# Patient Record
Sex: Female | Born: 1950
Health system: Southern US, Community
[De-identification: ages and names within clinical notes are randomized; demographics above are authoritative.]

## PROBLEM LIST (undated history)

## (undated) DIAGNOSIS — I252 Old myocardial infarction: Secondary | ICD-10-CM

## (undated) DIAGNOSIS — I10 Essential (primary) hypertension: Secondary | ICD-10-CM

## (undated) DIAGNOSIS — I214 Non-ST elevation (NSTEMI) myocardial infarction: Secondary | ICD-10-CM

## (undated) DIAGNOSIS — E785 Hyperlipidemia, unspecified: Secondary | ICD-10-CM

## (undated) DIAGNOSIS — B009 Herpesviral infection, unspecified: Secondary | ICD-10-CM

## (undated) DIAGNOSIS — I472 Ventricular tachycardia: Secondary | ICD-10-CM

## (undated) DIAGNOSIS — L409 Psoriasis, unspecified: Secondary | ICD-10-CM

## (undated) DIAGNOSIS — F419 Anxiety disorder, unspecified: Secondary | ICD-10-CM

## (undated) DIAGNOSIS — Z95820 Peripheral vascular angioplasty status with implants and grafts: Secondary | ICD-10-CM

## (undated) HISTORY — DX: Psoriasis, unspecified: L40.9

## (undated) HISTORY — DX: Old myocardial infarction: I25.2

## (undated) HISTORY — PX: TONSILLECTOMY AND ADENOIDECTOMY: SHX28

## (undated) HISTORY — DX: Herpesviral infection, unspecified: B00.9

## (undated) HISTORY — DX: Anxiety disorder, unspecified: F41.9

---

## 2003-11-09 ENCOUNTER — Ambulatory Visit (HOSPITAL_COMMUNITY): Admission: RE | Admit: 2003-11-09 | Discharge: 2003-11-09 | Payer: Self-pay | Admitting: Obstetrics and Gynecology

## 2004-12-28 IMAGING — US US PELVIS COMPLETE MODIFY
1 series · 14 of 25 positions shown · non-contrast
Comparison: none

CLINICAL DATA: Pelvic pain.  

 PELVIC ULTRASOUND ? TRANSABDOMINAL AND ENDOVAGINAL ? 11/09/03 AT 4433 HOURS

[Series 1: unknown · 0.38mm/px · 14 of 52 slices shown]
[im 1/52]
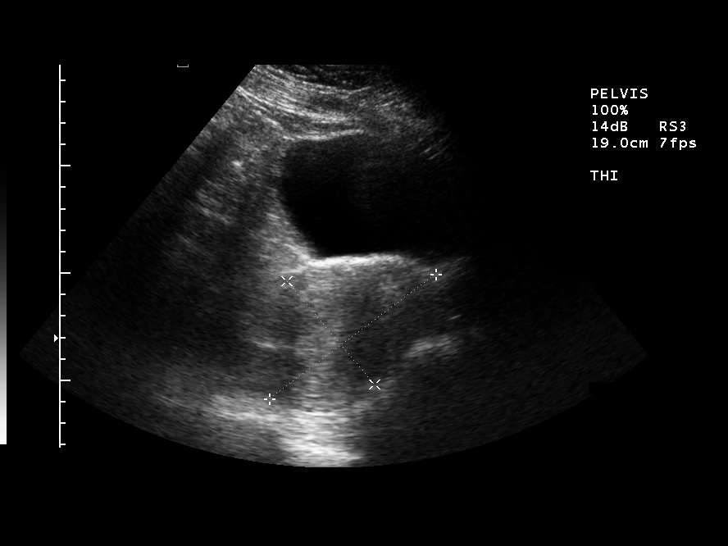
[im 5/52]
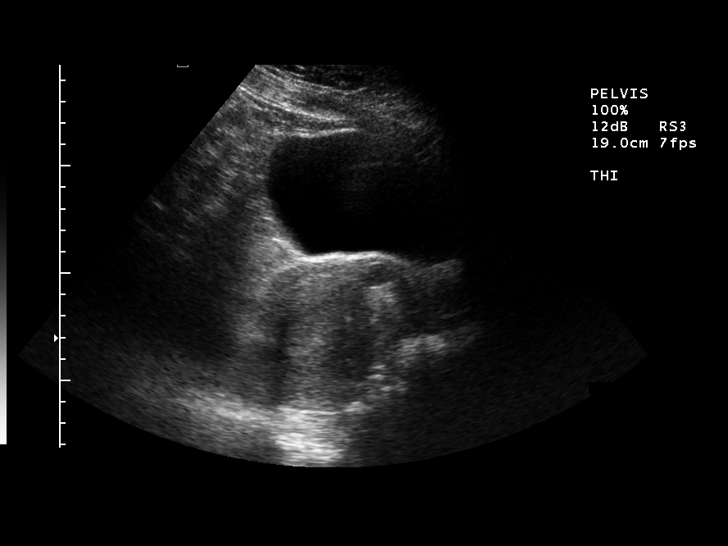
[im 9/52]
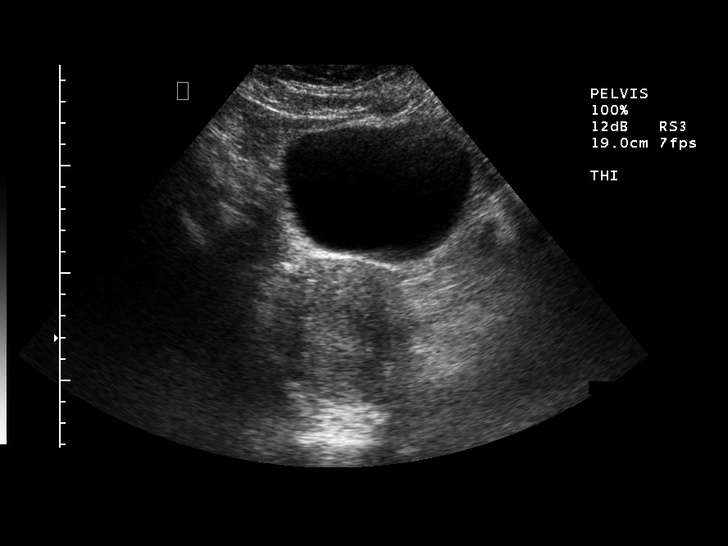
[im 13/52]
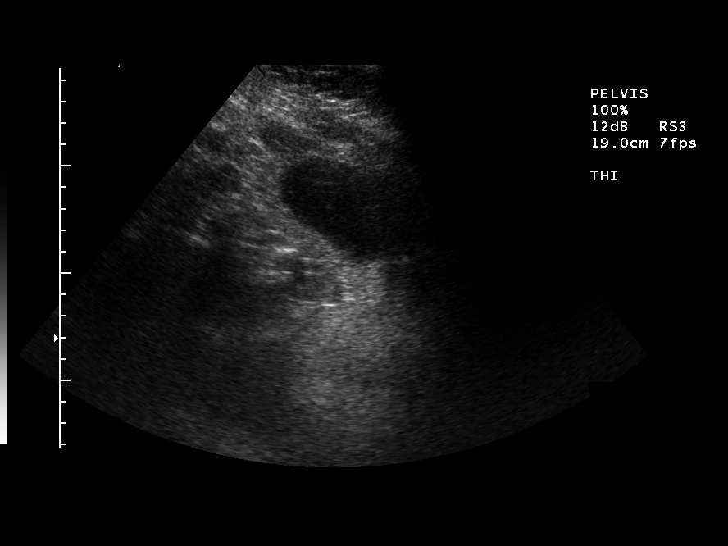
[im 18/52]
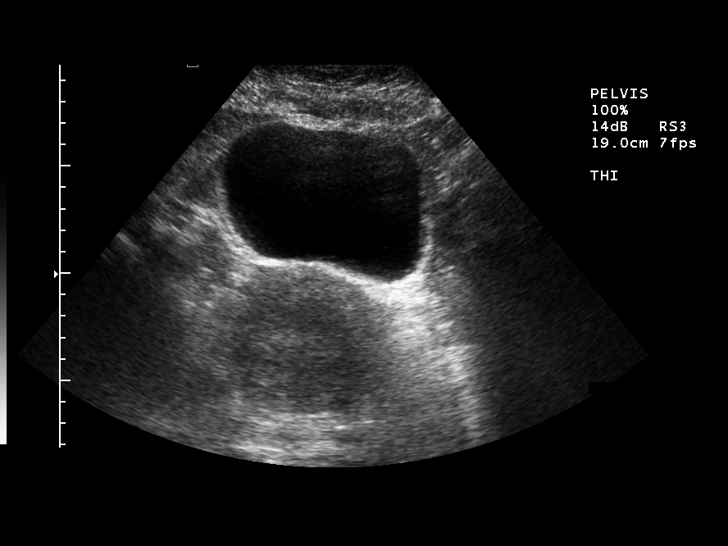
[im 20/52]
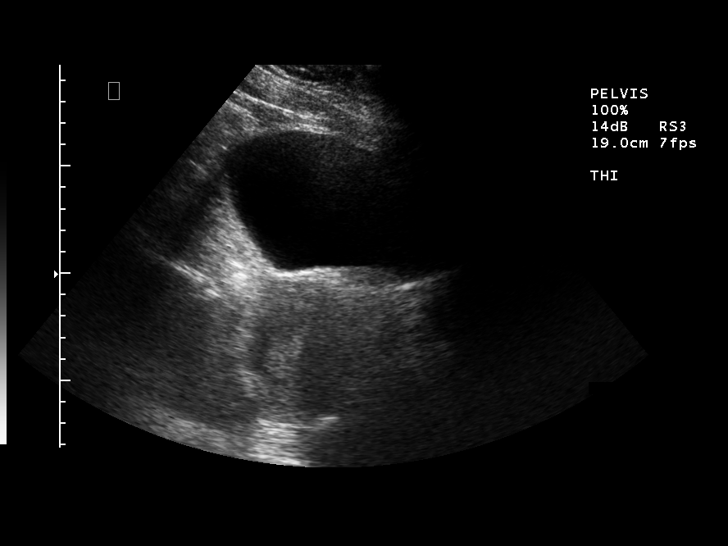
[im 24/52]
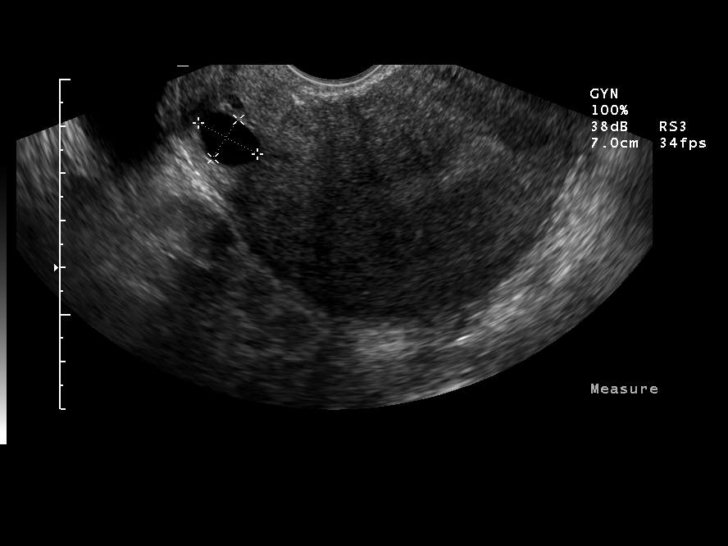
[im 28/52]
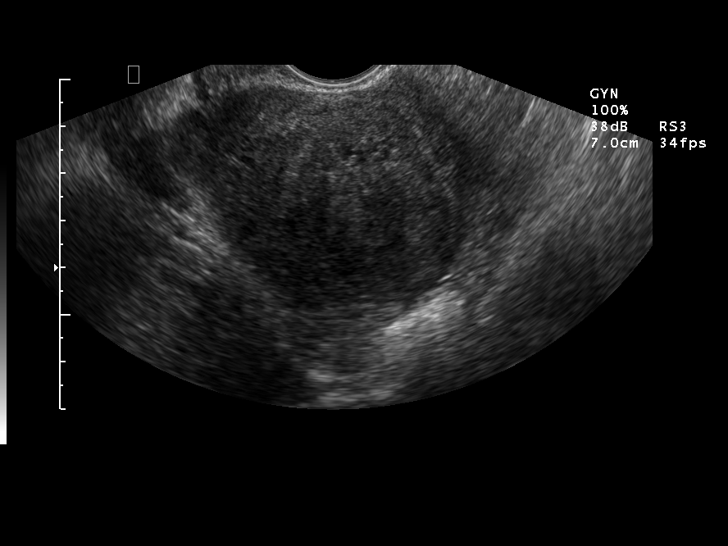
[im 32/52]
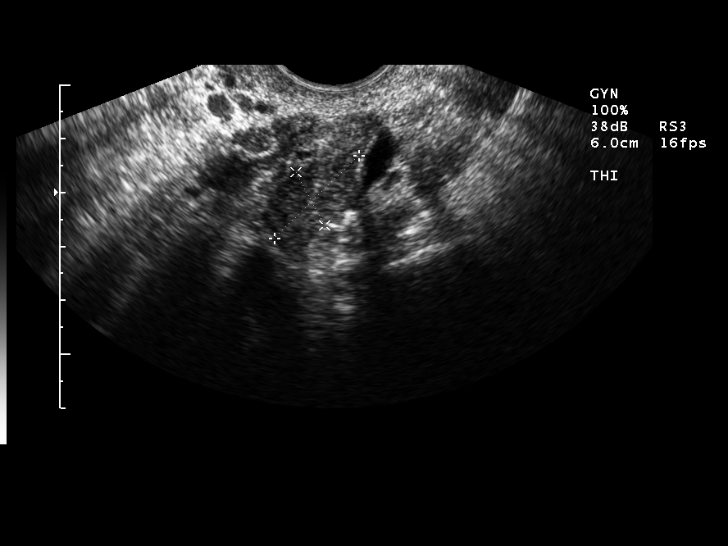
[im 35/52]
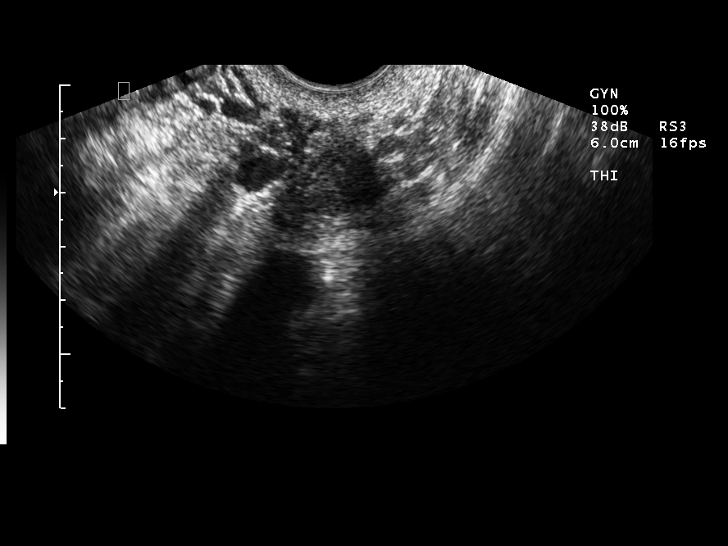
[im 39/52]
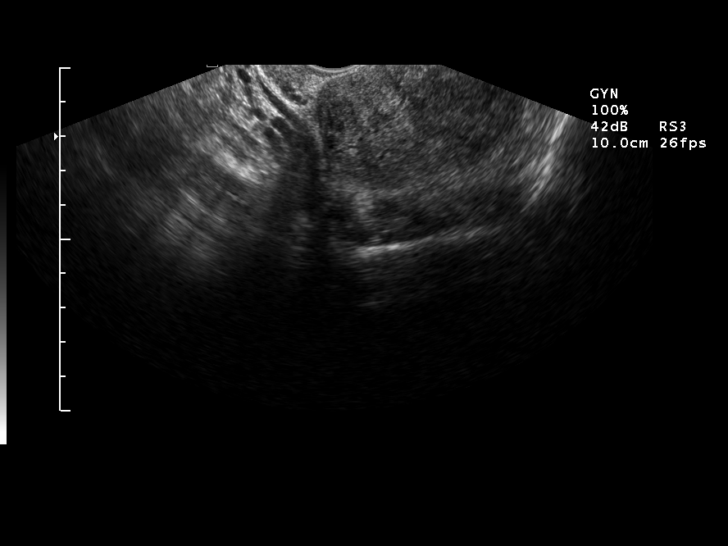
[im 43/52]
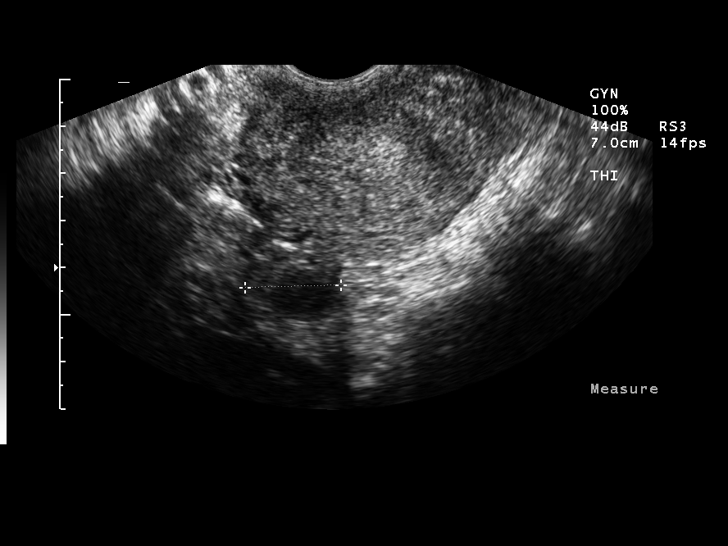
[im 47/52]
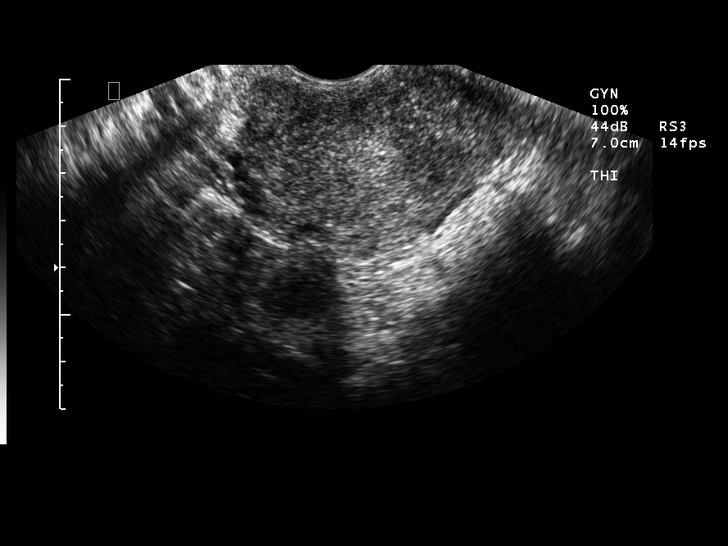
[im 52/52]
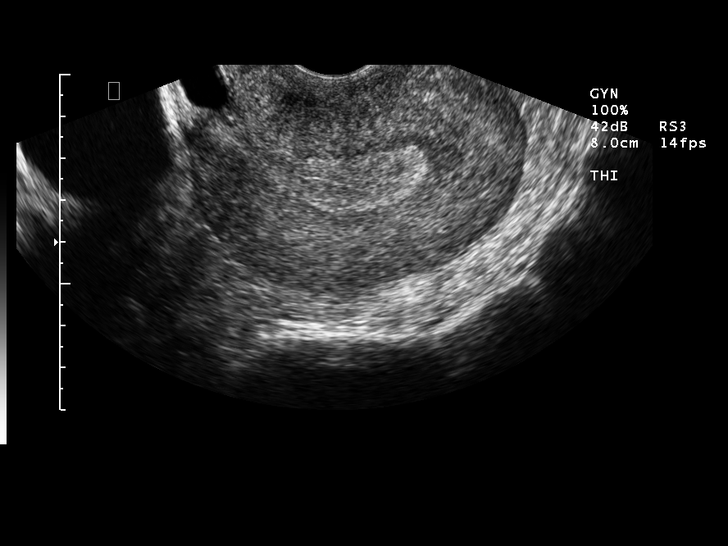

[14 of 25 positions shown; findings below may reference images not displayed]

FINDINGS: The uterus is 8.9 x 5.0 x 7.3 cm.  No masses are seen.  Echotexture of the myometrium is somewhat inhomogeneous of unknown significance.  A small nabothian cyst is noted.  The endometrial stripe is 13 mm in thickness.  Apparently the patient is still menstruating.

 The right ovary is 2.6 x 1.4 x 2.0 cm.  There is a simple 1.3 cm right ovarian cyst.  The left ovary is 2.2 x 1.1 x 1.5 cm.  There is no evidence of free fluid.

 IMPRESSION
 Echotexture of the myometrium is somewhat homogeneous of unknown significance.  The examination is otherwise within normal limits.

## 2008-10-25 ENCOUNTER — Encounter: Admission: RE | Admit: 2008-10-25 | Discharge: 2008-10-25 | Payer: Self-pay | Admitting: Family Medicine

## 2009-12-14 IMAGING — CR DG CHEST 2V
2 series · 2 of 2 positions shown · non-contrast
Comparison: No prior studies.

CLINICAL DATA: 2 months of dry cough.  Worse at night.

CHEST - 2 VIEW

[w chest pa]
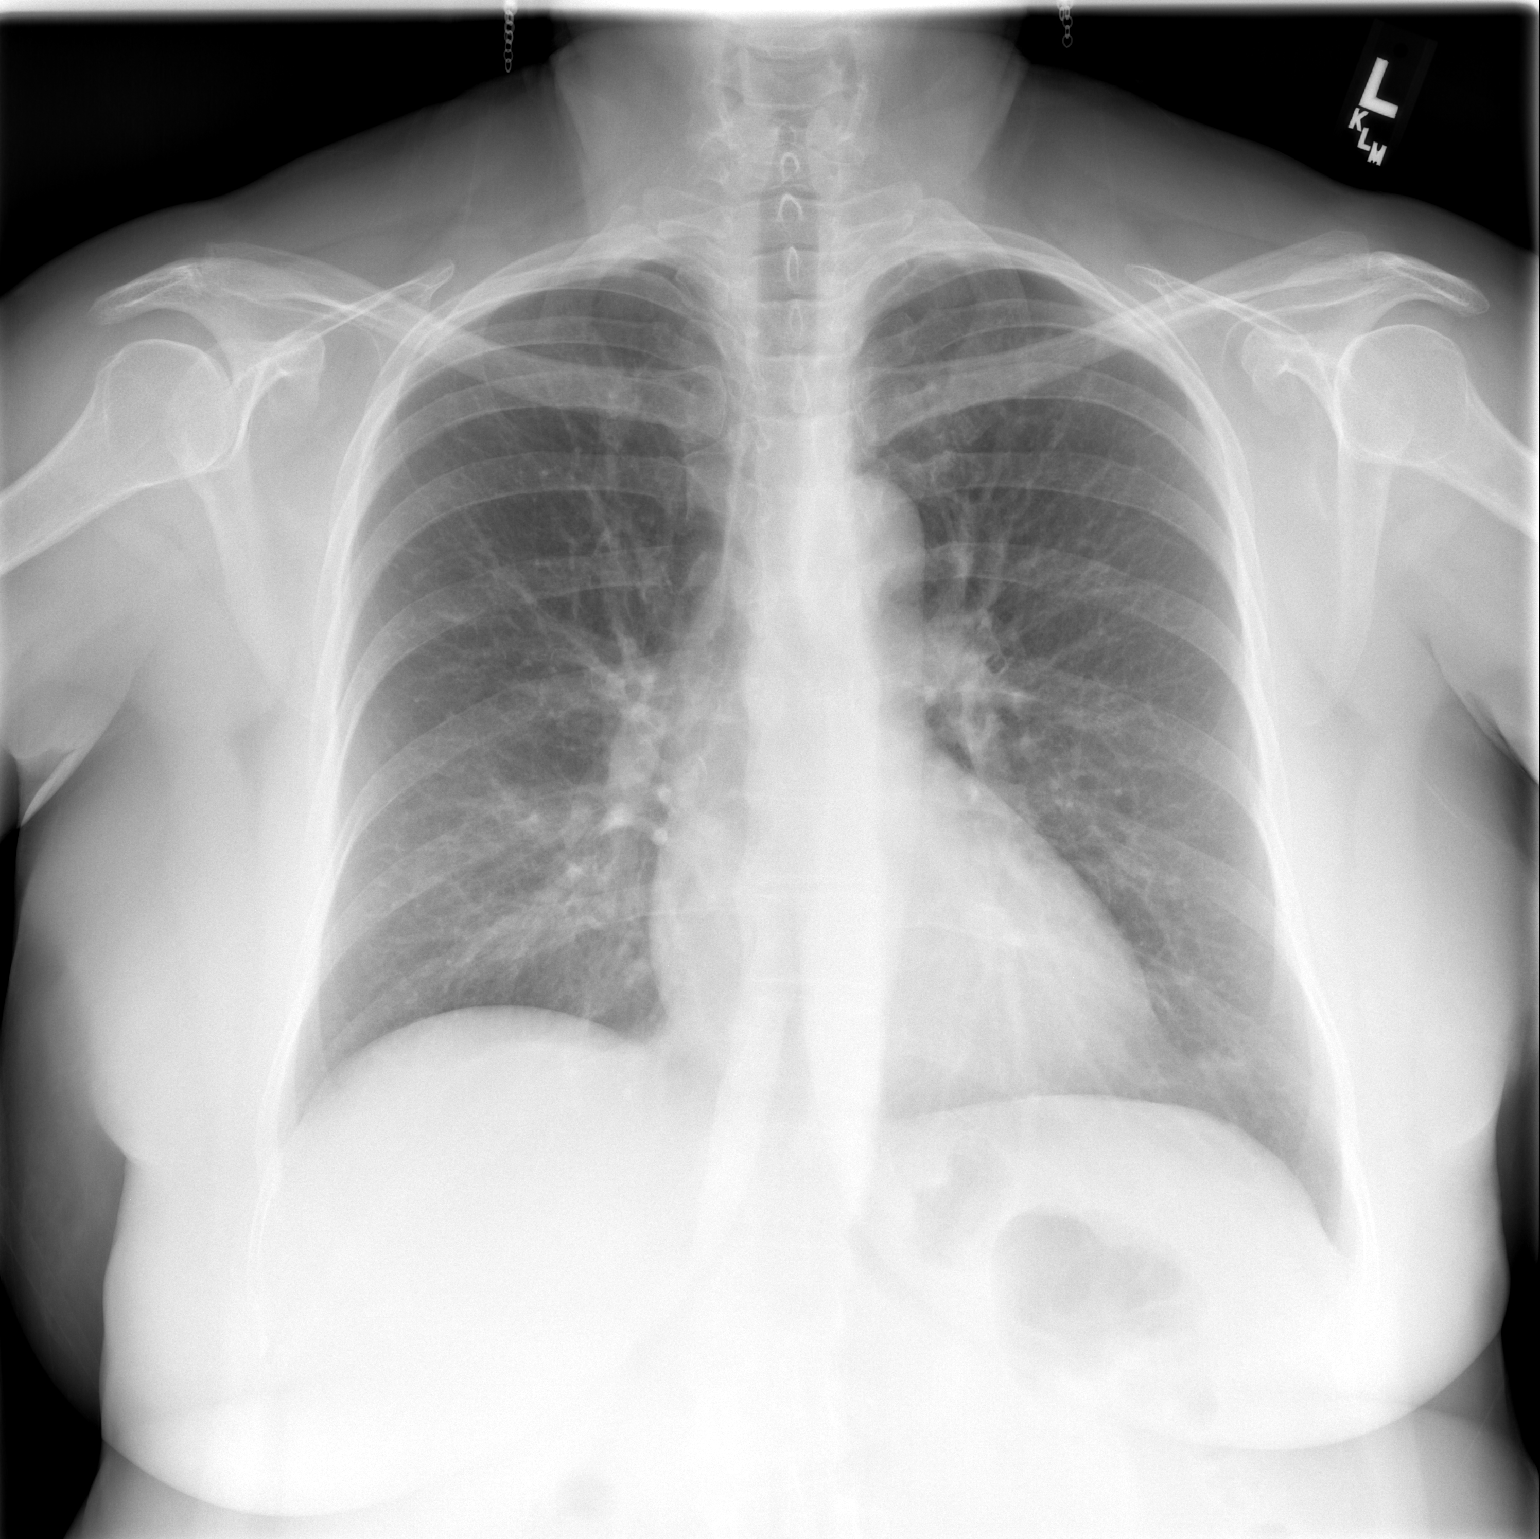

[w chest lat]
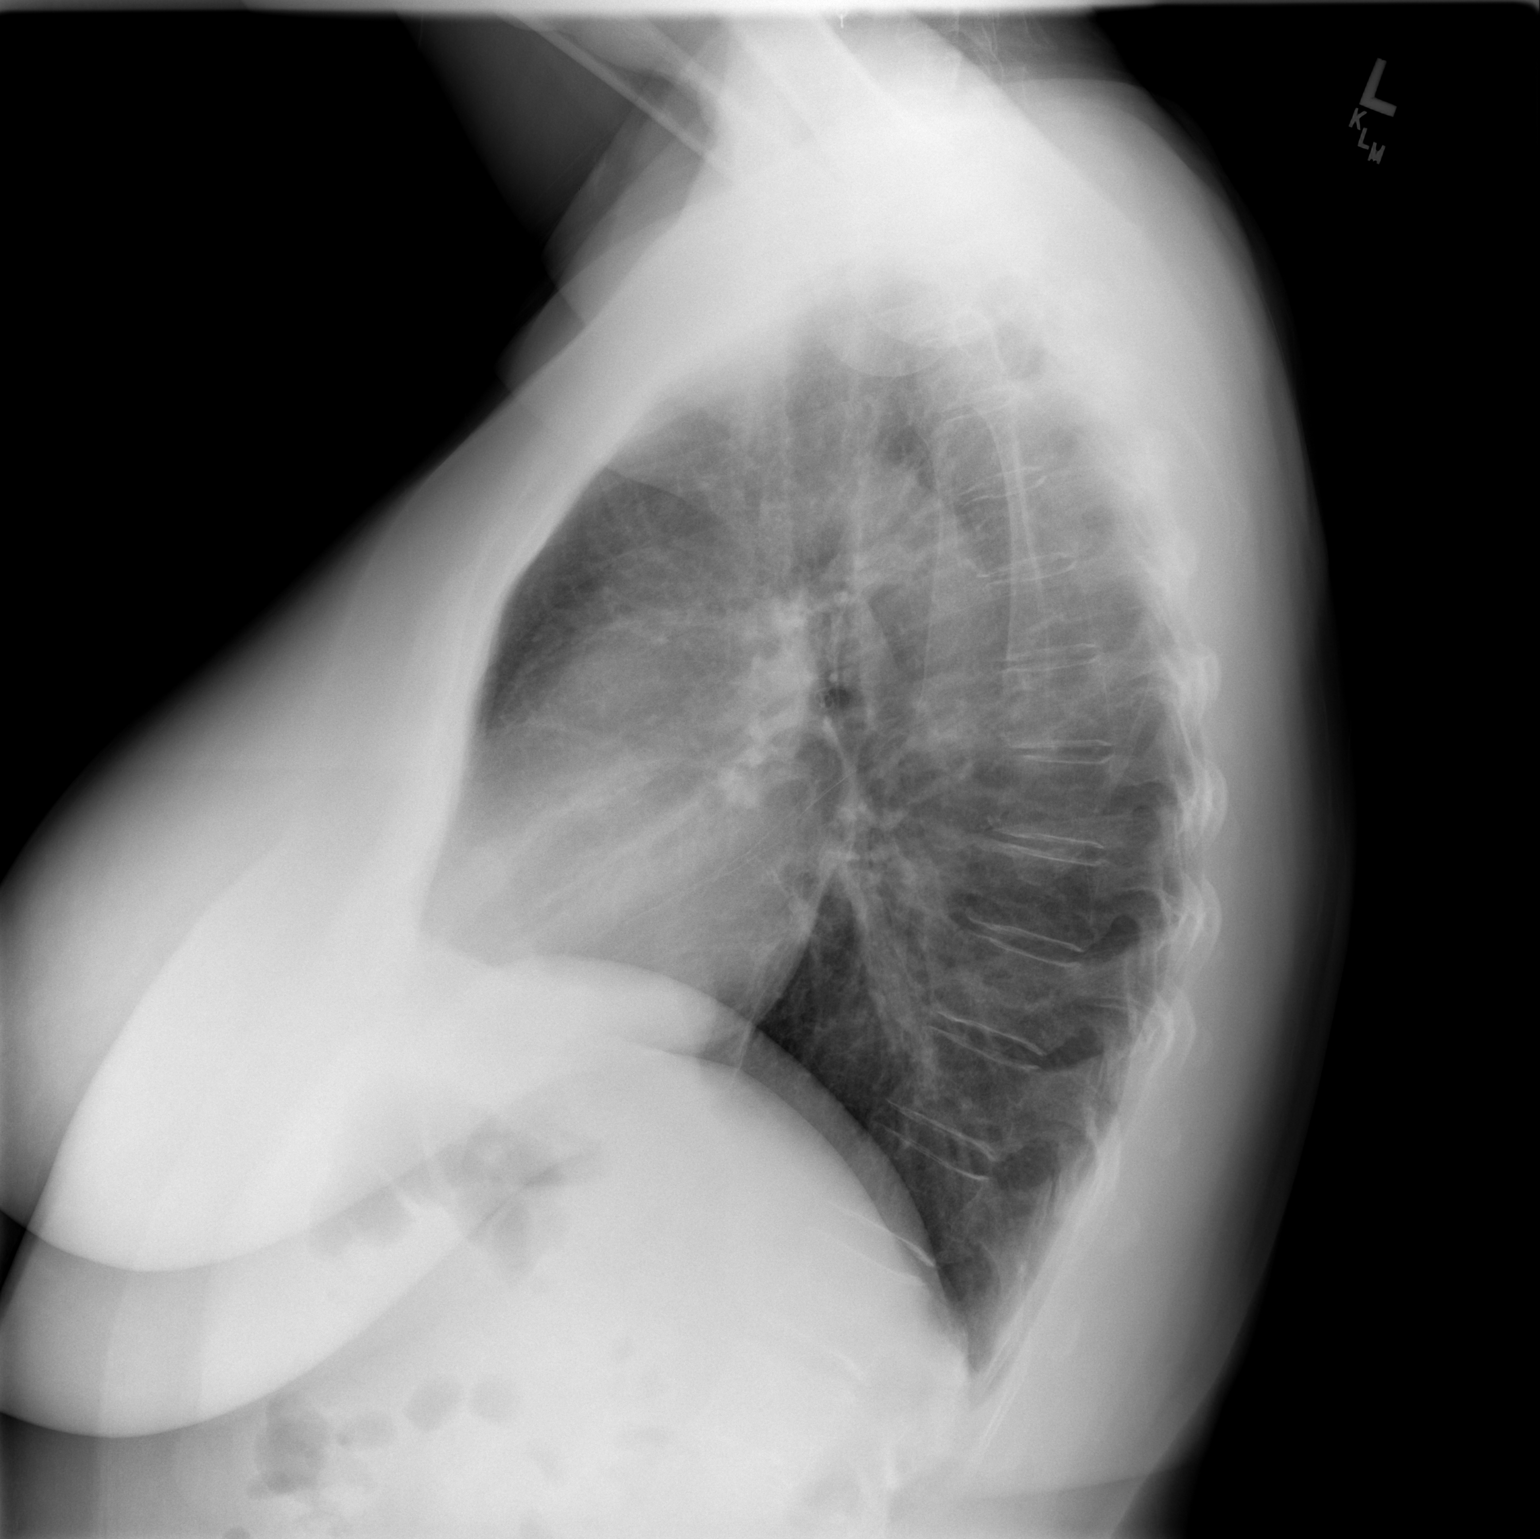

[2 of 2 positions shown; findings below may reference images not displayed]

FINDINGS: There are mildly accentuated bronchial markings present
consistent with mild changes of bronchitis.  There are no acute
infiltrates.  The heart and mediastinal structures are normal.
IMPRESSION: Mild bronchitic changes.  No acute infiltrate.

## 2010-07-31 ENCOUNTER — Emergency Department (HOSPITAL_COMMUNITY): Admission: EM | Admit: 2010-07-31 | Discharge: 2010-07-31 | Payer: Self-pay | Admitting: Emergency Medicine

## 2011-01-25 LAB — POCT I-STAT, CHEM 8
BUN: 9 mg/dL (ref 6–23)
Calcium, Ion: 1.11 mmol/L — ABNORMAL LOW (ref 1.12–1.32)
Chloride: 103 mEq/L (ref 96–112)
Creatinine, Ser: 0.8 mg/dL (ref 0.4–1.2)
Glucose, Bld: 121 mg/dL — ABNORMAL HIGH (ref 70–99)
HCT: 45 % (ref 36.0–46.0)
Hemoglobin: 15.3 g/dL — ABNORMAL HIGH (ref 12.0–15.0)
Potassium: 3.9 mEq/L (ref 3.5–5.1)
Sodium: 140 mEq/L (ref 135–145)
TCO2: 27 mmol/L (ref 0–100)

## 2011-01-25 LAB — URINE MICROSCOPIC-ADD ON

## 2011-01-25 LAB — URINALYSIS, ROUTINE W REFLEX MICROSCOPIC
Bilirubin Urine: NEGATIVE
Glucose, UA: NEGATIVE mg/dL
Ketones, ur: NEGATIVE mg/dL
Leukocytes, UA: NEGATIVE
Nitrite: NEGATIVE
Protein, ur: NEGATIVE mg/dL
Specific Gravity, Urine: 1.008 (ref 1.005–1.030)
Urobilinogen, UA: 0.2 mg/dL (ref 0.0–1.0)
pH: 6 (ref 5.0–8.0)

## 2011-05-29 ENCOUNTER — Other Ambulatory Visit (HOSPITAL_COMMUNITY)
Admission: RE | Admit: 2011-05-29 | Discharge: 2011-05-29 | Disposition: A | Discharge status: Home / Self Care | Payer: Managed Care, Other (non HMO) | Source: Ambulatory Visit | Attending: Family Medicine | Admitting: Family Medicine

## 2011-05-29 DIAGNOSIS — Z124 Encounter for screening for malignant neoplasm of cervix: Secondary | ICD-10-CM | POA: Insufficient documentation

## 2012-06-22 ENCOUNTER — Emergency Department (HOSPITAL_COMMUNITY): Payer: Managed Care, Other (non HMO)

## 2012-06-22 ENCOUNTER — Encounter (HOSPITAL_COMMUNITY): Payer: Self-pay | Admitting: Emergency Medicine

## 2012-06-22 ENCOUNTER — Emergency Department (HOSPITAL_COMMUNITY)
Admission: EM | Admit: 2012-06-22 | Discharge: 2012-06-22 | Disposition: A | Discharge status: Home / Self Care | Payer: Managed Care, Other (non HMO) | Attending: Emergency Medicine | Admitting: Emergency Medicine

## 2012-06-22 DIAGNOSIS — I10 Essential (primary) hypertension: Secondary | ICD-10-CM | POA: Insufficient documentation

## 2012-06-22 DIAGNOSIS — Z79899 Other long term (current) drug therapy: Secondary | ICD-10-CM | POA: Insufficient documentation

## 2012-06-22 DIAGNOSIS — R002 Palpitations: Secondary | ICD-10-CM

## 2012-06-22 DIAGNOSIS — R51 Headache: Secondary | ICD-10-CM | POA: Insufficient documentation

## 2012-06-22 DIAGNOSIS — R079 Chest pain, unspecified: Secondary | ICD-10-CM

## 2012-06-22 DIAGNOSIS — R0602 Shortness of breath: Secondary | ICD-10-CM | POA: Insufficient documentation

## 2012-06-22 HISTORY — DX: Essential (primary) hypertension: I10

## 2012-06-22 LAB — CBC WITH DIFFERENTIAL/PLATELET
Basophils Absolute: 0.1 10*3/uL (ref 0.0–0.1)
Basophils Relative: 1 % (ref 0–1)
Eosinophils Absolute: 0.2 10*3/uL (ref 0.0–0.7)
Eosinophils Relative: 2 % (ref 0–5)
HCT: 41.3 % (ref 36.0–46.0)
Hemoglobin: 14.3 g/dL (ref 12.0–15.0)
Lymphocytes Relative: 24 % (ref 12–46)
Lymphs Abs: 2.4 10*3/uL (ref 0.7–4.0)
MCH: 32 pg (ref 26.0–34.0)
MCHC: 34.6 g/dL (ref 30.0–36.0)
MCV: 92.4 fL (ref 78.0–100.0)
Monocytes Absolute: 0.7 10*3/uL (ref 0.1–1.0)
Monocytes Relative: 7 % (ref 3–12)
Neutro Abs: 6.6 10*3/uL (ref 1.7–7.7)
Neutrophils Relative %: 67 % (ref 43–77)
Platelets: 275 10*3/uL (ref 150–400)
RBC: 4.47 MIL/uL (ref 3.87–5.11)
RDW: 13 % (ref 11.5–15.5)
WBC: 9.9 10*3/uL (ref 4.0–10.5)

## 2012-06-22 LAB — BASIC METABOLIC PANEL
BUN: 11 mg/dL (ref 6–23)
CO2: 29 mEq/L (ref 19–32)
Calcium: 10.4 mg/dL (ref 8.4–10.5)
Chloride: 101 mEq/L (ref 96–112)
Creatinine, Ser: 0.8 mg/dL (ref 0.50–1.10)
GFR calc Af Amer: >90 mL/min (ref >90)
GFR calc non Af Amer: 79 mL/min — ABNORMAL LOW (ref >90)
Glucose, Bld: 120 mg/dL — ABNORMAL HIGH (ref 70–99)
Potassium: 4.3 mEq/L (ref 3.5–5.1)
Sodium: 139 mEq/L (ref 135–145)

## 2012-06-22 LAB — TROPONIN I: Troponin I: <0.3 ng/mL (ref <0.30)

## 2012-06-22 NOTE — ED Notes (Signed)
Ambulated patient to bathroom. S?O at the bedside

## 2012-06-22 NOTE — ED Provider Notes (Signed)
History     CSN: [id number]  Arrival date & time 06/22/12  5621   First MD Initiated Contact with Patient 06/22/12 0314      Chief Complaint  Patient presents with  . Headache    (Consider location/radiation/quality/duration/timing/severity/associated sxs/prior treatment) Patient is a 61 y.o. female presenting with headaches. The history is provided by the patient.  Headache  This is a new problem. Associated symptoms include palpitations. Pertinent negatives include no shortness of breath, no nausea and no vomiting.  patient had a sharp right-sided headache. It lasts around 2 hours. She later had some heaviness in her right arm. It was not necessarily associated with the headache. She later developed the feeling that her heart was going faster and some chest pressure. No trouble breathing. The symptoms have all pretty much resolved. Her blood pressure was found to be high at home 190/100. She states she's been running high recently. She is on HCTZ but only takes it occasionally. No fevers. No cough. She has family members that have died of heart attacks near her age.  Past Medical History  Diagnosis Date  . Hypertension     History reviewed. No pertinent past surgical history.  No family history on file.  History  Substance Use Topics  . Smoking status: Never Smoker   . Smokeless tobacco: Not on file  . Alcohol Use: No    OB History    Grav Para Term Preterm Abortions TAB SAB Ect Mult Living                  Review of Systems  Constitutional: Negative for activity change and appetite change.  HENT: Negative for neck stiffness.   Eyes: Negative for pain.  Respiratory: Negative for chest tightness and shortness of breath.   Cardiovascular: Positive for chest pain and palpitations. Negative for leg swelling.  Gastrointestinal: Negative for nausea, vomiting, abdominal pain and diarrhea.  Genitourinary: Negative for flank pain.  Musculoskeletal: Negative for back pain.    Skin: Negative for rash.  Neurological: Positive for weakness and headaches. Negative for numbness.  Psychiatric/Behavioral: Negative for behavioral problems.    Allergies  Sulfa antibiotics  Home Medications   Current Outpatient Rx  Name Route Sig Dispense Refill  . ASPIRIN 325 MG PO TABS Oral Take 325 mg by mouth once.    . ASPIRIN EC 81 MG PO TBEC Oral Take 162 mg by mouth daily as needed. For pain    . DIAZEPAM 5 MG PO TABS Oral Take 1.25 mg by mouth every 6 (six) hours as needed. For anxiety      BP 171/83  Pulse 81  Temp 97.9 F (36.6 C) (Oral)  Resp 16  Ht 5\' 4"  (1.626 m)  Wt 172 lb (78.019 kg)  BMI 29.52 kg/m2  SpO2 97%  Physical Exam  Nursing note and vitals reviewed. Constitutional: She is oriented to person, place, and time. She appears well-developed and well-nourished.  HENT:  Head: Normocephalic and atraumatic.  Eyes: EOM are normal. Pupils are equal, round, and reactive to light.  Neck: Normal range of motion. Neck supple.  Cardiovascular: Normal rate, regular rhythm and normal heart sounds.   No murmur heard. Pulmonary/Chest: Effort normal and breath sounds normal. No respiratory distress. She has no wheezes. She has no rales.  Abdominal: Soft. Bowel sounds are normal. She exhibits no distension. There is no tenderness. There is no rebound and no guarding.  Musculoskeletal: Normal range of motion.  Neurological: She is alert and oriented  to person, place, and time. No cranial nerve deficit.  Skin: Skin is warm and dry.  Psychiatric: She has a normal mood and affect. Her speech is normal.    ED Course  Procedures (including critical care time)  Labs Reviewed  BASIC METABOLIC PANEL - Abnormal; Notable for the following:    Glucose, Bld 120 (*)     GFR calc non Af Amer 79 (*)     All other components within normal limits  CBC WITH DIFFERENTIAL  TROPONIN I   Dg Chest 2 View  06/22/2012  *RADIOLOGY REPORT*  Clinical Data: Hypertension, shortness of  breath.  CHEST - 2 VIEW  Comparison: None.  Findings: Lungs are clear. No pleural effusion or pneumothorax. The cardiomediastinal contours are within normal limits. The visualized bones and soft tissues are without significant appreciable abnormality.  IMPRESSION: No radiographic evidence of acute cardiopulmonary process.  Original Report Authenticated By: Waneta Martins, M.D.   Ct Head Wo Contrast  06/22/2012  *RADIOLOGY REPORT*  Clinical Data: Headache, right arm heaviness.  CT HEAD WITHOUT CONTRAST  Technique:  Contiguous axial images were obtained from the base of the skull through the vertex without contrast.  Comparison: 07/31/2010  Findings: There is no evidence for acute hemorrhage, hydrocephalus, mass lesion, or abnormal extra-axial fluid collection.  No definite CT evidence for acute infarction.  The visualized paranasal sinuses and mastoid air cells are predominately clear.  IMPRESSION: No CT evidence for acute intracranial abnormality.  Original Report Authenticated By: Waneta Martins, M.D.     1. Chest pain   2. Palpitations   3. Headache     Date: 06/22/2012  Rate: 79  Rhythm: normal sinus rhythm  QRS Axis: normal  Intervals: normal  ST/T Wave abnormalities: nonspecific ST/T changes  Conduction Disutrbances:none  Narrative Interpretation:   Old EKG Reviewed: none available     MDM  Patient with headache sharp right-sided resolved. Also had indication of right arm feeling heavy. Also after that began to feel application for chest tightness. CT blood work EKG and x-ray are reassuring. Patient be discharged home to follow with her Dr.        Juliet Rude. Rubin Payor, MD 06/22/12 [phone]

## 2012-06-22 NOTE — ED Notes (Signed)
Addendum- patient took 3 baby asa and 1-asa that was 325. [patient states feels better now. No more heaviness in rt  Arm .

## 2012-06-22 NOTE — ED Notes (Signed)
Pt alert, arrives from home, c/o headache, onset was today, pt ambulates to triage, steady gait noted, speech clear, hx htn

## 2013-11-05 ENCOUNTER — Emergency Department (HOSPITAL_BASED_OUTPATIENT_CLINIC_OR_DEPARTMENT_OTHER)
Admission: EM | Admit: 2013-11-05 | Discharge: 2013-11-05 | Disposition: A | Discharge status: Home / Self Care | Payer: Managed Care, Other (non HMO) | Attending: Emergency Medicine | Admitting: Emergency Medicine

## 2013-11-05 ENCOUNTER — Encounter (HOSPITAL_BASED_OUTPATIENT_CLINIC_OR_DEPARTMENT_OTHER): Payer: Self-pay | Admitting: Emergency Medicine

## 2013-11-05 DIAGNOSIS — T4995XA Adverse effect of unspecified topical agent, initial encounter: Secondary | ICD-10-CM | POA: Insufficient documentation

## 2013-11-05 DIAGNOSIS — R220 Localized swelling, mass and lump, head: Secondary | ICD-10-CM | POA: Insufficient documentation

## 2013-11-05 DIAGNOSIS — Z7982 Long term (current) use of aspirin: Secondary | ICD-10-CM | POA: Insufficient documentation

## 2013-11-05 DIAGNOSIS — I10 Essential (primary) hypertension: Secondary | ICD-10-CM | POA: Insufficient documentation

## 2013-11-05 DIAGNOSIS — T7840XA Allergy, unspecified, initial encounter: Secondary | ICD-10-CM

## 2013-11-05 DIAGNOSIS — R209 Unspecified disturbances of skin sensation: Secondary | ICD-10-CM | POA: Insufficient documentation

## 2013-11-05 NOTE — ED Notes (Signed)
Pt sts pain, swelling, starting this morning around 0930 while brushing teeth and rinsing with Biotin (this is second time using it); Pt took Bendaryl, went to sleep, when awaking sts swelling had gone down. Now very sensitive, and feeling nauseated.

## 2013-11-05 NOTE — ED Provider Notes (Signed)
CSN: [id number]     Arrival date & time 11/05/13  1516 History   First MD Initiated Contact with Patient 11/05/13 1542     Chief Complaint  Patient presents with  . Oral Swelling   (Consider location/radiation/quality/duration/timing/severity/associated sxs/prior Treatment) The history is provided by the patient.   62 year old female around 9:30 this morning with brushing teeth and rinsing with the biotin for dry mouth. Second time she used it following that she developed some swelling of her left correction right upper lip. Has some sensitivity in that area some discomfort in the maxillary sinus area. Patient without headache without fever no focal findings. There was numbness to the right upper lip and to the maxillary part of the face.  Past Medical History  Diagnosis Date  . Hypertension    No past surgical history on file. No family history on file. History  Substance Use Topics  . Smoking status: Never Smoker   . Smokeless tobacco: Not on file  . Alcohol Use: No   OB History   Grav Para Term Preterm Abortions TAB SAB Ect Mult Living                 Review of Systems  Constitutional: Negative for fever.  HENT: Positive for facial swelling. Negative for congestion, sore throat and trouble swallowing.   Eyes: Negative for redness and visual disturbance.  Respiratory: Negative for shortness of breath.   Cardiovascular: Negative for chest pain.  Gastrointestinal: Negative for abdominal pain.  Genitourinary: Negative for dysuria.  Musculoskeletal: Negative for back pain.  Skin: Negative for rash.  Neurological: Positive for numbness. Negative for facial asymmetry, weakness and headaches.    Allergies  Sulfa antibiotics  Home Medications   Current Outpatient Rx  Name  Route  Sig  Dispense  Refill  . aspirin 325 MG tablet   Oral   Take 325 mg by mouth once.         Marland Kitchen aspirin EC 81 MG tablet   Oral   Take 162 mg by mouth daily as needed. For pain         .  diazepam (VALIUM) 5 MG tablet   Oral   Take 1.25 mg by mouth every 6 (six) hours as needed. For anxiety          BP 202/94  Pulse 120  Temp(Src) 98.5 F (36.9 C) (Oral)  Resp 20  Ht 5\' 4"  (1.626 m)  Wt 158 lb (71.668 kg)  BMI 27.11 kg/m2  SpO2 98% Physical Exam  Nursing note and vitals reviewed. Constitutional: She is oriented to person, place, and time. She appears well-developed and well-nourished. No distress.  HENT:  Head: Normocephalic and atraumatic.  Mouth/Throat: Oropharynx is clear and moist.  Mild swelling of the right upper lip. Oropharynx normal. Teeth in the upper right area with fillings but no gum swelling or distinct tooth tenderness.  Eyes: Conjunctivae and EOM are normal. Pupils are equal, round, and reactive to light.  Neck: Normal range of motion. Neck supple.  Cardiovascular: Normal rate, regular rhythm and normal heart sounds.   Pulmonary/Chest: Effort normal and breath sounds normal. No respiratory distress.  Abdominal: Soft. Bowel sounds are normal. There is no tenderness.  Musculoskeletal: Normal range of motion. She exhibits no edema and no tenderness.  Neurological: She is alert and oriented to person, place, and time. No cranial nerve deficit. She exhibits normal muscle tone. Coordination normal.  Skin: Skin is warm. No erythema.    ED Course  Procedures (including critical care time) Labs Review Labs Reviewed - No data to display Imaging Review No results found.  EKG Interpretation   None       MDM   1. Allergic reaction, initial encounter    Patient with symptoms more consistent with an allergic reaction to the mouthwash biotin. Patient's symptoms are not consistent with a stroke. Symptoms could be consistent with a tooth going down that side could be coincidental that the swelling occurred exactly the same time. Patient will continue Benadryl at home will return for any newer worse symptoms. Followup with her dentist if symptoms  persist.    Shelda Jakes, MD 11/05/13 [phone]

## 2013-11-06 ENCOUNTER — Encounter (HOSPITAL_BASED_OUTPATIENT_CLINIC_OR_DEPARTMENT_OTHER): Payer: Self-pay | Admitting: Emergency Medicine

## 2014-07-27 ENCOUNTER — Other Ambulatory Visit: Payer: Self-pay | Admitting: Family Medicine

## 2014-07-27 ENCOUNTER — Other Ambulatory Visit (HOSPITAL_COMMUNITY)
Admission: RE | Admit: 2014-07-27 | Discharge: 2014-07-27 | Disposition: A | Discharge status: Home / Self Care | Payer: Managed Care, Other (non HMO) | Source: Ambulatory Visit | Attending: Family Medicine | Admitting: Family Medicine

## 2014-07-27 DIAGNOSIS — Z1151 Encounter for screening for human papillomavirus (HPV): Secondary | ICD-10-CM | POA: Insufficient documentation

## 2014-07-27 DIAGNOSIS — Z124 Encounter for screening for malignant neoplasm of cervix: Secondary | ICD-10-CM | POA: Diagnosis present

## 2014-07-29 LAB — CYTOLOGY - PAP

## 2016-05-14 ENCOUNTER — Emergency Department (HOSPITAL_COMMUNITY): Payer: BLUE CROSS/BLUE SHIELD

## 2016-05-14 ENCOUNTER — Encounter (HOSPITAL_COMMUNITY): Payer: Self-pay | Admitting: *Deleted

## 2016-05-14 DIAGNOSIS — Z7982 Long term (current) use of aspirin: Secondary | ICD-10-CM | POA: Diagnosis not present

## 2016-05-14 DIAGNOSIS — M79622 Pain in left upper arm: Secondary | ICD-10-CM | POA: Diagnosis not present

## 2016-05-14 DIAGNOSIS — I10 Essential (primary) hypertension: Secondary | ICD-10-CM | POA: Insufficient documentation

## 2016-05-14 DIAGNOSIS — M79602 Pain in left arm: Secondary | ICD-10-CM | POA: Diagnosis present

## 2016-05-14 LAB — BASIC METABOLIC PANEL
Anion gap: 9 (ref 5–15)
BUN: 9 mg/dL (ref 6–20)
CO2: 27 mmol/L (ref 22–32)
Calcium: 9.4 mg/dL (ref 8.9–10.3)
Chloride: 103 mmol/L (ref 101–111)
Creatinine, Ser: 0.79 mg/dL (ref 0.44–1.00)
GFR calc Af Amer: >60 mL/min (ref >60)
GFR calc non Af Amer: >60 mL/min (ref >60)
Glucose, Bld: 108 mg/dL — ABNORMAL HIGH (ref 65–99)
Potassium: 3.1 mmol/L — ABNORMAL LOW (ref 3.5–5.1)
Sodium: 139 mmol/L (ref 135–145)

## 2016-05-14 LAB — CBC
HCT: 44.4 % (ref 36.0–46.0)
Hemoglobin: 14.5 g/dL (ref 12.0–15.0)
MCH: 31.5 pg (ref 26.0–34.0)
MCHC: 32.7 g/dL (ref 30.0–36.0)
MCV: 96.3 fL (ref 78.0–100.0)
Platelets: 302 10*3/uL (ref 150–400)
RBC: 4.61 MIL/uL (ref 3.87–5.11)
RDW: 13 % (ref 11.5–15.5)
WBC: 9.1 10*3/uL (ref 4.0–10.5)

## 2016-05-14 LAB — TROPONIN I: Troponin I: <0.03 ng/mL (ref <0.03)

## 2016-05-14 NOTE — ED Notes (Signed)
Patient presents with c/o left arm pain and chest pain.  Stated she was holding her dying dog and noticed pain in her arm  Patient tearful

## 2016-05-15 ENCOUNTER — Emergency Department (HOSPITAL_COMMUNITY)
Admission: EM | Admit: 2016-05-15 | Discharge: 2016-05-15 | Disposition: A | Discharge status: Home / Self Care | Payer: BLUE CROSS/BLUE SHIELD | Attending: Emergency Medicine | Admitting: Emergency Medicine

## 2016-05-15 DIAGNOSIS — M79602 Pain in left arm: Secondary | ICD-10-CM

## 2016-05-15 NOTE — Discharge Instructions (Signed)

## 2016-05-15 NOTE — ED Provider Notes (Signed)
CSN: [id number]     Arrival [id number] & time 05/14/16  2055 History   First MD Initiated Contact with Patient 05/15/16 [phone]     Chief Complaint  Patient presents with  . Arm Pain  . Hypertension     (Consider location/radiation/quality/duration/timing/severity/associated sxs/prior Treatment) HPI   Patient to the ER with hypertension, HSV, psoriasis, CP, and abdominal pain. She was holding her dog in her left arm for 30 + m,inutes very tightly while crying because the dog is dying. She has a family history of cardiac disease and became concerned and presented to the ED for evaluation. She took 324 mg aspirin prior to arrival. The pain resolved on its own after she put down the dog. She is not currently crying per my evaluation and says she is feeling much better. She did not experience N/V/D, jaw pain, back pain, CP, weakness, confusion.  Past Medical History  Diagnosis Date  . Hypertension     Significant white coat syndrome  . HSV infection   . Psoriasis   . Anxiety   . Chest pain, unspecified   . Abdominal pain, epigastric    Past Surgical History  Procedure Laterality Date  . Tonsillectomy and adenoidectomy     No family history on file. Social History  Substance Use Topics  . Smoking status: Never Smoker   . Smokeless tobacco: Never Used  . Alcohol Use: No   OB History    No data available     Review of Systems  Review of Systems All other systems negative except as documented in the HPI. All pertinent positives and negatives as reviewed in the HPI.   Allergies  Lisinopril and Sulfa antibiotics  Home Medications   Prior to Admission medications   Medication Sig Start Date End Date Taking? Authorizing Provider  aspirin 325 MG tablet Take 325 mg by mouth once.    Historical Provider, MD  aspirin EC 81 MG tablet Take 162 mg by mouth daily as needed. For pain    Historical Provider, MD  diazepam (VALIUM) 5 MG tablet Take 1.25 mg by mouth every 6 (six) hours as needed.  For anxiety    Historical Provider, MD  escitalopram (LEXAPRO) 10 MG tablet Take 5 mg by mouth daily.    Historical Provider, MD  Omega-3 Fatty Acids (OMEGA 3 PO) Take 1 capsule by mouth daily. With Meal    Historical Provider, MD   BP 168/88 mmHg  Pulse 84  Temp(Src) 97.6 F (36.4 C) (Oral)  Resp 12  Ht 5\' 4"  (1.626 m)  Wt 73.029 kg  BMI 27.62 kg/m2  SpO2 97% Physical Exam  Constitutional: She appears well-developed and well-nourished. No distress.  HENT:  Head: Normocephalic and atraumatic.  Right Ear: Tympanic membrane and ear canal normal.  Left Ear: Tympanic membrane and ear canal normal.  Nose: Nose normal.  Mouth/Throat: Uvula is midline, oropharynx is clear and moist and mucous membranes are normal.  Eyes: Pupils are equal, round, and reactive to light.  Neck: Normal range of motion. Neck supple.  Cardiovascular: Normal rate and regular rhythm.   Pulmonary/Chest: Effort normal.  Abdominal: Soft.  No signs of abdominal distention  Musculoskeletal:  No LE swelling  Neurological: She is alert.  Acting at baseline  Skin: Skin is warm and dry. No rash noted.  Nursing note and vitals reviewed.   ED Course  Procedures (including critical care time) Labs Review Labs Reviewed  BASIC METABOLIC PANEL - Abnormal; Notable for the following:  Potassium 3.1 (*)    Glucose, Bld 108 (*)    All other components within normal limits  CBC  TROPONIN I    Imaging Review Dg Chest 2 View  05/14/2016  CLINICAL DATA:  Chest pain and left arm pain. EXAM: CHEST  2 VIEW COMPARISON:  06/22/2012. FINDINGS: The cardiomediastinal contours are normal. The lungs are clear. Pulmonary vasculature is normal. No consolidation, pleural effusion, or pneumothorax. No acute osseous abnormalities are seen. There is an ovoid 3.5 cm density projecting either within or superficial to the left breast, this may be external to the patient. IMPRESSION: 1.  No acute pulmonary process. 2. Ovoid 3.5 cm density  may be superficial to or within the left breast, and may be external to the patient. Correlation with physical exam recommended. Electronically Signed   By: Rubye OaksMelanie  Ehinger M.D.   On: 05/14/2016 21:53   I have personally reviewed and evaluated these images and lab results as part of my medical decision-making.   EKG Interpretation   Date/Time:  Monday May 14 2016 21:00:50 EDT Ventricular Rate:  93 PR Interval:  154 QRS Duration: 82 QT Interval:  354 QTC Calculation: 440 R Axis:   41 Text Interpretation:  Normal sinus rhythm Nonspecific ST abnormality  Abnormal ECG No significant change since last tracing Confirmed by Erroll Lunani,  Adeleke Ayokunle [phone]([id number]) on 05/15/2016 12:35:15 AM      MDM   Final diagnoses:  Pain of left upper extremity    Patient has risk factors of age, hypertension, family history (3) with a Heart Score of 2  Patient is to be discharged with recommendation to follow up with PCP in regards to today's hospital visit. Chest pain is not likely of cardiac or pulmonary etiology d/t presentation, perc negative, VSS, no tracheal deviation, no JVD or new murmur, RRR, breath sounds equal bilaterally, EKG without acute abnormalities, negative troponin, and negative CXR. Pt has been advised come to ED is she developers CP that becomes exertional, associated with diaphoresis or nausea, radiates to left jaw/arm, worsens or becomes concerning in any way. Pt appears reliable for follow up and is agreeable to discharge.   Case has been discussed with  by Dr. Mora Bellmanni who agrees with the above plan to discharge. Pain is likely MSK from holding her dog. She has good emotional support with her here, her sister. And denies SI.     Marlon Peliffany Jevon [place]page, PA-C 05/15/16 0120  Tomasita CrumbleAdeleke Oni, MD 05/15/16 [phone]

## 2017-05-28 ENCOUNTER — Emergency Department (HOSPITAL_COMMUNITY): Payer: BLUE CROSS/BLUE SHIELD

## 2017-05-28 ENCOUNTER — Encounter (HOSPITAL_COMMUNITY): Payer: Self-pay

## 2017-05-28 ENCOUNTER — Encounter (HOSPITAL_COMMUNITY)
Admission: EM | Disposition: A | Discharge status: Home / Self Care | Payer: Self-pay | Source: Home / Self Care | Attending: Cardiovascular Disease

## 2017-05-28 ENCOUNTER — Other Ambulatory Visit: Payer: Self-pay

## 2017-05-28 ENCOUNTER — Inpatient Hospital Stay (HOSPITAL_COMMUNITY)
Admission: EM | Admit: 2017-05-28 | Discharge: 2017-05-29 | DRG: 247 | Disposition: A | Discharge status: Home / Self Care | Payer: BLUE CROSS/BLUE SHIELD | Attending: Cardiovascular Disease | Admitting: Cardiovascular Disease

## 2017-05-28 DIAGNOSIS — F419 Anxiety disorder, unspecified: Secondary | ICD-10-CM | POA: Diagnosis present

## 2017-05-28 DIAGNOSIS — I351 Nonrheumatic aortic (valve) insufficiency: Secondary | ICD-10-CM | POA: Diagnosis not present

## 2017-05-28 DIAGNOSIS — Z79899 Other long term (current) drug therapy: Secondary | ICD-10-CM | POA: Diagnosis not present

## 2017-05-28 DIAGNOSIS — Z955 Presence of coronary angioplasty implant and graft: Secondary | ICD-10-CM

## 2017-05-28 DIAGNOSIS — I10 Essential (primary) hypertension: Secondary | ICD-10-CM | POA: Diagnosis present

## 2017-05-28 DIAGNOSIS — I252 Old myocardial infarction: Secondary | ICD-10-CM | POA: Diagnosis present

## 2017-05-28 DIAGNOSIS — I214 Non-ST elevation (NSTEMI) myocardial infarction: Secondary | ICD-10-CM | POA: Diagnosis not present

## 2017-05-28 DIAGNOSIS — L409 Psoriasis, unspecified: Secondary | ICD-10-CM | POA: Diagnosis present

## 2017-05-28 DIAGNOSIS — I249 Acute ischemic heart disease, unspecified: Secondary | ICD-10-CM

## 2017-05-28 DIAGNOSIS — Z8249 Family history of ischemic heart disease and other diseases of the circulatory system: Secondary | ICD-10-CM

## 2017-05-28 DIAGNOSIS — Z888 Allergy status to other drugs, medicaments and biological substances status: Secondary | ICD-10-CM

## 2017-05-28 DIAGNOSIS — I472 Ventricular tachycardia: Secondary | ICD-10-CM | POA: Diagnosis present

## 2017-05-28 DIAGNOSIS — E785 Hyperlipidemia, unspecified: Secondary | ICD-10-CM | POA: Diagnosis present

## 2017-05-28 DIAGNOSIS — Z7982 Long term (current) use of aspirin: Secondary | ICD-10-CM | POA: Diagnosis not present

## 2017-05-28 DIAGNOSIS — Z882 Allergy status to sulfonamides status: Secondary | ICD-10-CM | POA: Diagnosis not present

## 2017-05-28 DIAGNOSIS — I4729 Other ventricular tachycardia: Secondary | ICD-10-CM

## 2017-05-28 DIAGNOSIS — Z95820 Peripheral vascular angioplasty status with implants and grafts: Secondary | ICD-10-CM

## 2017-05-28 DIAGNOSIS — I2510 Atherosclerotic heart disease of native coronary artery without angina pectoris: Secondary | ICD-10-CM

## 2017-05-28 HISTORY — DX: Hyperlipidemia, unspecified: E78.5

## 2017-05-28 HISTORY — DX: Peripheral vascular angioplasty status with implants and grafts: Z95.820

## 2017-05-28 HISTORY — DX: Non-ST elevation (NSTEMI) myocardial infarction: I21.4

## 2017-05-28 HISTORY — PX: CORONARY STENT INTERVENTION: CATH118234

## 2017-05-28 HISTORY — PX: LEFT HEART CATH AND CORONARY ANGIOGRAPHY: CATH118249

## 2017-05-28 HISTORY — DX: Ventricular tachycardia: I47.2

## 2017-05-28 LAB — CBC WITH DIFFERENTIAL/PLATELET
BASOS ABS: 0.1 10*3/uL (ref 0.0–0.1)
Basophils Relative: 1 %
EOS ABS: 0.2 10*3/uL (ref 0.0–0.7)
EOS PCT: 2 %
HCT: 41.7 % (ref 36.0–46.0)
Hemoglobin: 14.5 g/dL (ref 12.0–15.0)
LYMPHS PCT: 22 %
Lymphs Abs: 2.2 10*3/uL (ref 0.7–4.0)
MCH: 31.9 pg (ref 26.0–34.0)
MCHC: 34.8 g/dL (ref 30.0–36.0)
MCV: 91.9 fL (ref 78.0–100.0)
MONO ABS: 0.7 10*3/uL (ref 0.1–1.0)
Monocytes Relative: 7 %
Neutro Abs: 6.9 10*3/uL (ref 1.7–7.7)
Neutrophils Relative %: 68 %
PLATELETS: 238 10*3/uL (ref 150–400)
RBC: 4.54 MIL/uL (ref 3.87–5.11)
RDW: 13 % (ref 11.5–15.5)
WBC: 10 10*3/uL (ref 4.0–10.5)

## 2017-05-28 LAB — TROPONIN I: TROPONIN I: 2.22 ng/mL — AB (ref <0.03)

## 2017-05-28 LAB — BASIC METABOLIC PANEL
Anion gap: 9 (ref 5–15)
BUN: 16 mg/dL (ref 6–20)
CALCIUM: 9.2 mg/dL (ref 8.9–10.3)
CO2: 27 mmol/L (ref 22–32)
CREATININE: 0.75 mg/dL (ref 0.44–1.00)
Chloride: 101 mmol/L (ref 101–111)
GFR calc Af Amer: >60 mL/min (ref >60)
GLUCOSE: 134 mg/dL — AB (ref 65–99)
Potassium: 3.6 mmol/L (ref 3.5–5.1)
Sodium: 137 mmol/L (ref 135–145)

## 2017-05-28 LAB — PROTIME-INR
INR: 0.95
Prothrombin Time: 12.7 seconds (ref 11.4–15.2)

## 2017-05-28 LAB — I-STAT TROPONIN, ED: TROPONIN I, POC: 0.21 ng/mL — AB (ref 0.00–0.08)

## 2017-05-28 LAB — HEPARIN LEVEL (UNFRACTIONATED): HEPARIN UNFRACTIONATED: 0.24 [IU]/mL — AB (ref 0.30–0.70)

## 2017-05-28 LAB — POCT ACTIVATED CLOTTING TIME: ACTIVATED CLOTTING TIME: 252 s

## 2017-05-28 LAB — APTT: aPTT: 27 seconds (ref 24–36)

## 2017-05-28 SURGERY — LEFT HEART CATH AND CORONARY ANGIOGRAPHY
Anesthesia: LOCAL

## 2017-05-28 MED ORDER — ASPIRIN 81 MG PO CHEW
324.0000 mg | CHEWABLE_TABLET | ORAL | Status: AC
  Administered 2017-05-28: 324 mg via ORAL

## 2017-05-28 MED ORDER — MORPHINE SULFATE (PF) 4 MG/ML IV SOLN
INTRAVENOUS | Status: AC
  Filled 2017-05-28: qty 1

## 2017-05-28 MED ORDER — ASPIRIN 81 MG PO CHEW
81.0000 mg | CHEWABLE_TABLET | Freq: Every day | ORAL | Status: DC
  Administered 2017-05-29: 11:00:00 81 mg via ORAL
  Filled 2017-05-28: qty 1

## 2017-05-28 MED ORDER — SODIUM CHLORIDE 0.9% FLUSH: 3.0000 mL | INTRAVENOUS | Status: DC | PRN

## 2017-05-28 MED ORDER — HEPARIN BOLUS VIA INFUSION
3800.0000 [IU] | Freq: Once | INTRAVENOUS | Status: AC
  Administered 2017-05-28: 3800 [IU] via INTRAVENOUS
  Filled 2017-05-28: qty 3800

## 2017-05-28 MED ORDER — SODIUM CHLORIDE 0.9 % WEIGHT BASED INFUSION: 1.0000 mL/kg/h | INTRAVENOUS | Status: DC

## 2017-05-28 MED ORDER — ATORVASTATIN CALCIUM 80 MG PO TABS
80.0000 mg | ORAL_TABLET | Freq: Every day | ORAL | Status: DC
  Administered 2017-05-28 – 2017-05-29 (×2): 80 mg via ORAL
  Filled 2017-05-28 (×2): qty 1

## 2017-05-28 MED ORDER — SODIUM CHLORIDE 0.9 % IV SOLN
250.0000 mL | INTRAVENOUS | Status: DC | PRN
Start: 2017-05-28 — End: 2017-05-28

## 2017-05-28 MED ORDER — IOPAMIDOL (ISOVUE-370) INJECTION 76%
INTRAVENOUS | Status: AC
  Filled 2017-05-28: qty 100

## 2017-05-28 MED ORDER — LABETALOL HCL 5 MG/ML IV SOLN: 10.0000 mg | INTRAVENOUS | Status: AC | PRN

## 2017-05-28 MED ORDER — LIDOCAINE HCL (PF) 1 % IJ SOLN
INTRAMUSCULAR | Status: AC
  Filled 2017-05-28: qty 30

## 2017-05-28 MED ORDER — SODIUM CHLORIDE 0.9% FLUSH
3.0000 mL | Freq: Two times a day (BID) | INTRAVENOUS | Status: DC
  Administered 2017-05-28: 22:00:00 3 mL via INTRAVENOUS

## 2017-05-28 MED ORDER — MIDAZOLAM HCL 2 MG/2ML IJ SOLN
INTRAMUSCULAR | Status: DC | PRN
  Administered 2017-05-28: 1 mg via INTRAVENOUS

## 2017-05-28 MED ORDER — HEPARIN SODIUM (PORCINE) 1000 UNIT/ML IJ SOLN
INTRAMUSCULAR | Status: AC
  Filled 2017-05-28: qty 1

## 2017-05-28 MED ORDER — MORPHINE SULFATE (PF) 4 MG/ML IV SOLN
4.0000 mg | Freq: Once | INTRAVENOUS | Status: AC
  Administered 2017-05-28: 4 mg via INTRAVENOUS

## 2017-05-28 MED ORDER — HEPARIN (PORCINE) IN NACL 100-0.45 UNIT/ML-% IJ SOLN
900.0000 [IU]/h | INTRAMUSCULAR | Status: DC
  Administered 2017-05-28: 750 [IU]/h via INTRAVENOUS
  Filled 2017-05-28: qty 250

## 2017-05-28 MED ORDER — METOPROLOL SUCCINATE ER 25 MG PO TB24
25.0000 mg | ORAL_TABLET | Freq: Every day | ORAL | Status: DC
  Administered 2017-05-28 – 2017-05-29 (×2): 25 mg via ORAL
  Filled 2017-05-28 (×2): qty 1

## 2017-05-28 MED ORDER — MIDAZOLAM HCL 2 MG/2ML IJ SOLN
INTRAMUSCULAR | Status: AC
  Filled 2017-05-28: qty 2

## 2017-05-28 MED ORDER — ONDANSETRON HCL 4 MG/2ML IJ SOLN: 4.0000 mg | Freq: Four times a day (QID) | INTRAMUSCULAR | Status: DC | PRN

## 2017-05-28 MED ORDER — TICAGRELOR 90 MG PO TABS
ORAL_TABLET | ORAL | Status: AC
  Filled 2017-05-28: qty 2

## 2017-05-28 MED ORDER — ANGIOPLASTY BOOK
Freq: Once | Status: AC
  Administered 2017-05-28: 22:00:00
  Filled 2017-05-28: qty 1

## 2017-05-28 MED ORDER — ACETAMINOPHEN 325 MG PO TABS
650.0000 mg | ORAL_TABLET | ORAL | Status: DC | PRN
  Administered 2017-05-28 – 2017-05-29 (×3): 650 mg via ORAL
  Filled 2017-05-28 (×3): qty 2

## 2017-05-28 MED ORDER — ASPIRIN EC 81 MG PO TBEC
81.0000 mg | DELAYED_RELEASE_TABLET | Freq: Every day | ORAL | Status: DC | PRN
Start: 2017-05-29 — End: 2017-05-28

## 2017-05-28 MED ORDER — HEPARIN (PORCINE) IN NACL 2-0.9 UNIT/ML-% IJ SOLN
INTRAMUSCULAR | Status: AC
  Filled 2017-05-28: qty 1000

## 2017-05-28 MED ORDER — NITROGLYCERIN 0.4 MG SL SUBL: 0.4000 mg | SUBLINGUAL_TABLET | SUBLINGUAL | Status: DC | PRN

## 2017-05-28 MED ORDER — SODIUM CHLORIDE 0.9 % WEIGHT BASED INFUSION: 1.0000 mL/kg/h | INTRAVENOUS | Status: AC

## 2017-05-28 MED ORDER — HYDRALAZINE HCL 20 MG/ML IJ SOLN: 5.0000 mg | INTRAMUSCULAR | Status: AC | PRN

## 2017-05-28 MED ORDER — VERAPAMIL HCL 2.5 MG/ML IV SOLN
INTRAVENOUS | Status: DC | PRN
  Administered 2017-05-28: 16:00:00 via INTRA_ARTERIAL

## 2017-05-28 MED ORDER — LIDOCAINE HCL (PF) 1 % IJ SOLN
INTRAMUSCULAR | Status: DC | PRN
  Administered 2017-05-28: 2 mL

## 2017-05-28 MED ORDER — SODIUM CHLORIDE 0.9 % WEIGHT BASED INFUSION
3.0000 mL/kg/h | INTRAVENOUS | Status: DC
  Administered 2017-05-28: 3 mL/kg/h via INTRAVENOUS

## 2017-05-28 MED ORDER — NITROGLYCERIN IN D5W 200-5 MCG/ML-% IV SOLN
10.0000 ug/min | INTRAVENOUS | Status: DC
  Administered 2017-05-28: 10 ug/min via INTRAVENOUS
  Filled 2017-05-28: qty 250

## 2017-05-28 MED ORDER — HEPARIN SODIUM (PORCINE) 1000 UNIT/ML IJ SOLN
INTRAMUSCULAR | Status: DC | PRN
  Administered 2017-05-28: 2000 [IU] via INTRAVENOUS
  Administered 2017-05-28: 3500 [IU] via INTRAVENOUS
  Administered 2017-05-28: 2000 [IU] via INTRAVENOUS

## 2017-05-28 MED ORDER — VERAPAMIL HCL 2.5 MG/ML IV SOLN
INTRAVENOUS | Status: AC
  Filled 2017-05-28: qty 2

## 2017-05-28 MED ORDER — ACETAMINOPHEN 325 MG PO TABS
650.0000 mg | ORAL_TABLET | Freq: Once | ORAL | Status: AC
  Administered 2017-05-28: 650 mg via ORAL
  Filled 2017-05-28: qty 2

## 2017-05-28 MED ORDER — ASPIRIN 300 MG RE SUPP: 300.0000 mg | RECTAL | Status: AC

## 2017-05-28 MED ORDER — VITAMIN D 1000 UNITS PO TABS
5000.0000 [IU] | ORAL_TABLET | Freq: Every day | ORAL | Status: DC
Start: 2017-05-29 — End: 2017-05-29
  Administered 2017-05-29: 11:00:00 5000 [IU] via ORAL
  Filled 2017-05-28: qty 5

## 2017-05-28 MED ORDER — GI COCKTAIL ~~LOC~~
30.0000 mL | ORAL | Status: AC
  Administered 2017-05-28: 30 mL via ORAL
  Filled 2017-05-28: qty 30

## 2017-05-28 MED ORDER — SODIUM CHLORIDE 0.9% FLUSH
3.0000 mL | INTRAVENOUS | Status: DC | PRN
  Administered 2017-05-29 (×2): 3 mL via INTRAVENOUS
  Filled 2017-05-28 (×2): qty 3

## 2017-05-28 MED ORDER — SODIUM CHLORIDE 0.9% FLUSH
3.0000 mL | Freq: Two times a day (BID) | INTRAVENOUS | Status: DC
  Administered 2017-05-29: 3 mL via INTRAVENOUS

## 2017-05-28 MED ORDER — SODIUM CHLORIDE 0.9% FLUSH: 3.0000 mL | Freq: Two times a day (BID) | INTRAVENOUS | Status: DC

## 2017-05-28 MED ORDER — ASPIRIN 81 MG PO CHEW
324.0000 mg | CHEWABLE_TABLET | Freq: Once | ORAL | Status: AC
  Administered 2017-05-28: 324 mg via ORAL
  Filled 2017-05-28: qty 4

## 2017-05-28 MED ORDER — ASPIRIN 81 MG PO CHEW
CHEWABLE_TABLET | ORAL | Status: AC
  Filled 2017-05-28: qty 4

## 2017-05-28 MED ORDER — TICAGRELOR 90 MG PO TABS
90.0000 mg | ORAL_TABLET | Freq: Two times a day (BID) | ORAL | Status: DC
  Administered 2017-05-29 (×2): 90 mg via ORAL
  Filled 2017-05-28 (×2): qty 1

## 2017-05-28 MED ORDER — SODIUM CHLORIDE 0.9 % IV SOLN: 250.0000 mL | INTRAVENOUS | Status: DC | PRN

## 2017-05-28 MED ORDER — HEPARIN (PORCINE) IN NACL 2-0.9 UNIT/ML-% IJ SOLN
INTRAMUSCULAR | Status: AC | PRN
  Administered 2017-05-28: 1000 mL

## 2017-05-28 MED ORDER — FENTANYL CITRATE (PF) 100 MCG/2ML IJ SOLN
INTRAMUSCULAR | Status: AC
  Filled 2017-05-28: qty 2

## 2017-05-28 MED ORDER — ENOXAPARIN SODIUM 40 MG/0.4ML ~~LOC~~ SOLN: 40.0000 mg | SUBCUTANEOUS | Status: DC

## 2017-05-28 MED ORDER — ASPIRIN 81 MG PO CHEW: 81.0000 mg | CHEWABLE_TABLET | ORAL | Status: DC

## 2017-05-28 MED ORDER — TICAGRELOR 90 MG PO TABS
ORAL_TABLET | ORAL | Status: DC | PRN
  Administered 2017-05-28: 180 mg via ORAL

## 2017-05-28 MED ORDER — FENTANYL CITRATE (PF) 100 MCG/2ML IJ SOLN
INTRAMUSCULAR | Status: DC | PRN
  Administered 2017-05-28: 25 ug via INTRAVENOUS

## 2017-05-28 MED ORDER — IOPAMIDOL (ISOVUE-370) INJECTION 76%
INTRAVENOUS | Status: DC | PRN
  Administered 2017-05-28: 160 mL via INTRA_ARTERIAL

## 2017-05-28 SURGICAL SUPPLY — 18 items
BALLN SAPPHIRE 2.5X12 (BALLOONS) ×2
BALLN ~~LOC~~ EMERGE MR 3.25X15 (BALLOONS) ×2
BALLOON SAPPHIRE 2.5X12 (BALLOONS) ×1 IMPLANT
BALLOON ~~LOC~~ EMERGE MR 3.25X15 (BALLOONS) ×1 IMPLANT
CATH 5FR JL3.5 JR4 ANG PIG MP (CATHETERS) ×2 IMPLANT
CATH LAUNCHER 6FR EBU 3 (CATHETERS) ×2 IMPLANT
DEVICE RAD COMP TR BAND LRG (VASCULAR PRODUCTS) ×2 IMPLANT
GLIDESHEATH SLEND SS 6F .021 (SHEATH) ×2 IMPLANT
GUIDEWIRE INQWIRE 1.5J.035X260 (WIRE) ×1 IMPLANT
INQWIRE 1.5J .035X260CM (WIRE) ×2
KIT ENCORE 26 ADVANTAGE (KITS) ×2 IMPLANT
KIT HEART LEFT (KITS) ×2 IMPLANT
PACK CARDIAC CATHETERIZATION (CUSTOM PROCEDURE TRAY) ×2 IMPLANT
STENT SYNERGY DES 3X24 (Permanent Stent) ×2 IMPLANT
SYR MEDRAD MARK V 150ML (SYRINGE) ×2 IMPLANT
TRANSDUCER W/STOPCOCK (MISCELLANEOUS) ×2 IMPLANT
TUBING CIL FLEX 10 FLL-RA (TUBING) ×2 IMPLANT
WIRE ASAHI PROWATER 180CM (WIRE) ×2 IMPLANT

## 2017-05-28 NOTE — Progress Notes (Signed)
ANTICOAGULATION CONSULT NOTE - follow up  Pharmacy Consult for heparin Indication: chest pain/ACS  Allergies  Allergen Reactions  . Lisinopril Other (See Comments)    Lightheaded/fatigue  . Sulfa Antibiotics     White coating under the tongue    Patient Measurements: Height: 5\' 4"  (162.6 cm) Weight: 140 lb (63.5 kg) IBW/kg (Calculated) : 54.7 Heparin Dosing Weight: 63.5kg  Vital Signs: BP: 131/81 (07/17 1330) Pulse Rate: 69 (07/17 1330)  Labs:  Recent Labs  05/28/17 0527 05/28/17 0611 05/28/17 1329  HGB 14.5  --   --   HCT 41.7  --   --   PLT 238  --   --   APTT  --  27  --   LABPROT  --  12.7  --   INR  --  0.95  --   HEPARINUNFRC  --   --  0.24*  CREATININE 0.75  --   --   TROPONINI  --   --  2.22*    Estimated Creatinine Clearance: 61.3 mL/min (by C-G formula based on SCr of 0.75 mg/dL).   Medical History: Past Medical History:  Diagnosis Date  . Anxiety   . HSV infection   . Hypertension    Significant white coat syndrome  . Psoriasis      Assessment: 66 yo female presents with complaints of upper back and neck pain since Saturday.  Troponins 0.21, pharmacy consulted to dose heparin, pt on aspirin but no other anticoagulation. Baseline labs WNL  Today, 05/28/17  Heparin level subtherapeutic on current IV heparin rate of 750 units/hr  No reported bleeding   Goal of Therapy:  Heparin level 0.3-0.7 units/ml Monitor platelets by anticoagulation protocol   Plan:  1) Increase IV heparin from 750 units/hr to 900 units/hr 2) Recheck heparin level 6 hours after rate change 3) Daily heparin level and CBC   Hessie KnowsJustin M Mayci Haning, PharmD, BCPS Pager [phone] 05/28/2017 2:28 PM

## 2017-05-28 NOTE — ED Notes (Signed)
Abnormal lab result MD Glick have been made aware 

## 2017-05-28 NOTE — ED Notes (Signed)
Pt updated about bed status.  [facility]

## 2017-05-28 NOTE — Progress Notes (Signed)
ANTICOAGULATION CONSULT NOTE - Initial Consult  Pharmacy Consult for heparin Indication: chest pain/ACS  Allergies  Allergen Reactions  . Lisinopril Other (See Comments)    Lightheaded/fatigue  . Sulfa Antibiotics     White coating under the tongue    Patient Measurements: Height: 5\' 4"  (162.6 cm) Weight: 140 lb (63.5 kg) IBW/kg (Calculated) : 54.7 Heparin Dosing Weight: 63.5kg  Vital Signs: BP: 137/89 (07/17 0444) Pulse Rate: 62 (07/17 0444)  Labs:  Recent Labs  05/28/17 0527  HGB 14.5  HCT 41.7  PLT 238    CrCl cannot be calculated (Patient's most recent lab result is older than the maximum 21 days allowed.).   Medical History: Past Medical History:  Diagnosis Date  . Abdominal pain, epigastric   . Anxiety   . Chest pain, unspecified   . HSV infection   . Hypertension    Significant white coat syndrome  . Psoriasis      Assessment: 66 yo female presents with complaints of upper back and neck pain since Saturday.  Troponins 0.21, pharmacy consulted to dose heparin, pt on aspirin but no other anticoagulation.  H/H WNL Plts WNL Scr WNL  Goal of Therapy:  Heparin level 0.3-0.7 units/ml Monitor platelets by anticoagulation protocol   Plan:  Baseline PTT, PT/INR ordered stat Heparin bolus 3800 units x 1 then start Heparin drip 750 units/hr Heparin level in 6 hours Daily CBC/heparin level   Arley PhenixEllen Trenese Haft RPh 05/28/2017, 6:19 AM Pager [phone]

## 2017-05-28 NOTE — Interval H&P Note (Signed)
History and Physical Interval Note:  05/28/2017 3:33 PM  Maria LeepAnita D Lamb  has presented today for surgery, with the diagnosis of cp  The various methods of treatment have been discussed with the patient and family. After consideration of risks, benefits and other options for treatment, the patient has consented to  Procedure(s): Left Heart Cath and Coronary Angiography (N/A) as a surgical intervention .  The patient's history has been reviewed, patient examined, no change in status, stable for surgery.  I have reviewed the patient's chart and labs.  Questions were answered to the patient's satisfaction.   Cath Lab Visit (complete for each Cath Lab visit)  Clinical Evaluation Leading to the Procedure:   ACS: Yes.    Non-ACS:    Anginal Classification: CCS IV  Anti-ischemic medical therapy: Maximal Therapy (2 or more classes of medications)  Non-Invasive Test Results: No non-invasive testing performed  Prior CABG: No previous CABG        Peter SwazilandJordan MD,FACC 05/28/2017 3:33 PM

## 2017-05-28 NOTE — ED Provider Notes (Signed)
[facility]-EMERGENCY DEPT Provider Note   CSN: [id number] Arrival date & time: 05/28/17  0435     History   Chief Complaint Chief Complaint  Patient presents with  . Generalized Body Aches    HPI Maria Lamb is a 66 y.o. female.  The history is provided by the patient.  She comes in with a three-day history of intermittent chest and upper back pain. Initial episode occurred 3 days ago and is described as a burning pain in her interscapular area which did radiate to the anterior chest. This resolved, but recurred 2 days ago. She took multiple medications including naproxen and valacyclovir, and pain went away. She then did well until 3 AM where she was awakened by a similar pain. There is no associated dyspnea, nausea, diaphoresis. She feels better when she is sitting up. She denies any pleuritic component. Pain is not affected by exertion. She denies tobacco use, diabetes, hyperlipidemia. She does have history of hypertension, and there is a family history of premature coronary atherosclerosis.  Past Medical History:  Diagnosis Date  . Abdominal pain, epigastric   . Anxiety   . Chest pain, unspecified   . HSV infection   . Hypertension    Significant white coat syndrome  . Psoriasis     There are no active problems to display for this patient.   Past Surgical History:  Procedure Laterality Date  . TONSILLECTOMY AND ADENOIDECTOMY      OB History    No data available       Home Medications    Prior to Admission medications   Medication Sig Start Date End Date Taking? Authorizing Provider  aspirin 325 MG tablet Take 325 mg by mouth once.    [provider]  aspirin EC 81 MG tablet Take 162 mg by mouth daily as needed. For pain    [provider]  diazepam (VALIUM) 5 MG tablet Take 1.25 mg by mouth every 6 (six) hours as needed. For anxiety    [provider]  escitalopram (LEXAPRO) 10 MG tablet Take 5 mg by mouth daily.    [provider]  Omega-3 Fatty Acids (OMEGA 3 PO) Take 1 capsule by mouth daily. With Meal    [provider]    Family History History reviewed. No pertinent family history.  Social History Social History  Substance Use Topics  . Smoking status: Never Smoker  . Smokeless tobacco: Never Used  . Alcohol use No     Allergies   Lisinopril and Sulfa antibiotics   Review of Systems Review of Systems  All other systems reviewed and are negative.    Physical Exam Updated Vital Signs BP 137/89 (BP Location: Right Arm)   Pulse 62   Resp 18   SpO2 100%   Physical Exam  Nursing note and vitals reviewed.  66 year old female, resting comfortably and in no acute distress. Vital signs are normal. Oxygen saturation is 100%, which is normal. Head is normocephalic and atraumatic. PERRLA, EOMI. Oropharynx is clear. Neck is nontender and supple without adenopathy or JVD. Back is nontender and there is no CVA tenderness. Lungs are clear without rales, wheezes, or rhonchi. Chest has mild tenderness which is fairly well localized in the left parasternal area. Heart has regular rate and rhythm without murmur. Abdomen is soft, flat, nontender without masses or hepatosplenomegaly and peristalsis is normoactive. Extremities have no cyanosis or edema, full range of motion is present. Skin is warm and dry. Multiple  erythematous areas with scaling are present consistent with psoriasis. No other rash seen.. Neurologic: Mental status is normal, cranial nerves are intact, there are no motor or sensory deficits.  ED Treatments / Results  Labs (all labs ordered are listed, but only abnormal results are displayed) Labs Reviewed  BASIC METABOLIC PANEL - Abnormal; Notable for the following:       Result Value   Glucose, Bld 134 (*)    All other components within normal limits  I-STAT TROPOININ, ED - Abnormal; Notable for the following:    Troponin i, poc 0.21 (*)    All other components  within normal limits  CBC WITH DIFFERENTIAL/PLATELET  APTT  PROTIME-INR  HEPARIN LEVEL (UNFRACTIONATED)    EKG  EKG Interpretation  Date/Time:  Tuesday May 28 2017 05:11:05 EDT Ventricular Rate:  56 PR Interval:    QRS Duration: 113 QT Interval:  468 QTC Calculation: 452 R Axis:   6 Text Interpretation:  Sinus rhythm Borderline intraventricular conduction delay Abnormal R-wave progression, early transition Minimal ST depression, anterolateral leads Baseline wander in lead(s) III When compared with ECG of 05/14/2016, HEART RATE has decreased Confirmed by Dione BoozeGlick, Twylla Arceneaux ([id number]) on 05/28/2017 5:17:49 AM       Radiology Dg Chest 2 View  Result Date: 05/28/2017 CLINICAL DATA:  66 year old female with chest pain. EXAM: CHEST  2 VIEW COMPARISON:  Chest radiograph dated 05/14/2016 FINDINGS: The heart size and mediastinal contours are within normal limits. Both lungs are clear. The visualized skeletal structures are unremarkable. IMPRESSION: No active cardiopulmonary disease. Electronically Signed   By: Elgie CollardArash  Radparvar M.D.   On: 05/28/2017 06:24    Procedures Procedures (including critical care time) CRITICAL CARE Performed by: IONGE,XBMWUGLICK,Sirus Labrie Total critical care time: 40 minutes Critical care time was exclusive of separately billable procedures and treating other patients. Critical care was necessary to treat or prevent imminent or life-threatening deterioration. Critical care was time spent personally by me on the following activities: development of treatment plan with patient and/or surrogate as well as nursing, discussions with consultants, evaluation of patient's response to treatment, examination of patient, obtaining history from patient or surrogate, ordering and performing treatments and interventions, ordering and review of laboratory studies, ordering and review of radiographic studies, pulse oximetry and re-evaluation of patient's condition.  Medications Ordered in ED Medications    aspirin chewable tablet 324 mg (324 mg Oral Given 05/28/17 0535)  gi cocktail (Maalox,Lidocaine,Donnatal) (30 mLs Oral Given 05/28/17 0535)     Initial Impression / Assessment and Plan / ED Course  I have reviewed the triage vital signs and the nursing notes.  Pertinent labs & imaging results that were available during my care of the patient were reviewed by me and considered in my medical decision making (see chart for details).  Chest pain of uncertain etiology. Burning nature and better when she sitting up to suggest GI origin. However, ECG does have ST changes at baseline, difficult to assess for additional changes. She will be given aspirin and a trial of a GI cocktail. Old records are reviewed, and she has been seen in the ED for chest pain several years ago.   Troponin is come back mildly elevated at 0.21. However, picture is consistent with ACS and non-STEMI. She started on heparin and nitroglycerin drips. Case is discussed with Dr. Jomarie Longsroituru of cardiology service who agrees except the patient and [facility].  Final Clinical Impressions(s) / ED Diagnoses   Final diagnoses:  NSTEMI (non-ST elevated myocardial infarction) (  HCC)    New Prescriptions New Prescriptions   No medications on file     Dione Booze, MD 05/28/17 [phone]

## 2017-05-28 NOTE — ED Triage Notes (Signed)
Pt complains of upper back and neck pain since Saturday, she also states there is a burning in her chest Pt has taken ibuprofen and valtrex, thinking she was having a shingles breakout, pt denies any rash, nausea or vomiting

## 2017-05-28 NOTE — H&P (Signed)
Patient ID: [name] MRN: [id number], DOB/AGE: Apr 27, 1951   Admit date: 05/28/2017  Requesting Physician:Dr. Preston Fleeting Primary Physician: Maria Raider, MD Primary Cardiologist: New  Reason for admission: Acute Coronary Syndrome  HPI:  Maria Lamb is a 66 y.o. female with a history of anxiety, HSV infection, hypertension, psoriasis presented to the ER with centralized chest pain that woke her out of her sleep this morning around 3 am.   She has not had problems with chest pain in the past, she saw Dr. Mendel Ryder > 10 years ago and reports having a nuclear stress test that she was told was normal. Per chart review she apparently was seen in the ED many years ago for chest pain. She sometimes gets pains to her body that are abnormal because of her psoriasis and HSV. Last week she had burning to her upper back intrascapular region that mildly radiated into her chest. It eventually went away after taking multiple doses of medications: Naproxen and valacyclovir. She  Had pain again over the weekend but it was very vague, she took medications again and it improved.  This morning around 3AM she had a central sternal pain that she describes as a burning, different then the other episodes of pain that woke her up out of her sleep. This concerned her enough that she got up to get ready and presented to the emergency department.  The ER work-up was completed. She has a mildly elevated troponin level at 0.21, minimal ST changes that are present at baseline  In the anterolateral leads. Chest xray unremarkable, CBC and BMP unremarkable as well. She is well appearing and hemodynamically stable Pulse 62, Respirations 13, BP 120/71, O2 96% on room air. No temperature was taken.    Problem List  Past Medical History:  Diagnosis Date  . Abdominal pain, epigastric   . Anxiety   . Chest pain, unspecified   . HSV infection   . Hypertension    Significant white coat syndrome  . Psoriasis       Past Surgical History:  Procedure Laterality Date  . TONSILLECTOMY AND ADENOIDECTOMY       Allergies  Allergies  Allergen Reactions  . Lisinopril Other (See Comments)    Lightheaded/fatigue  . Sulfa Antibiotics     White coating under the tongue     Home Medications  Prior to Admission medications   Medication Sig Start Date End Date Taking? Authorizing Provider  aspirin EC 81 MG tablet Take 81-162 mg by mouth daily as needed for mild pain or moderate pain.    Yes [provider]  Cholecalciferol (VITAMIN D3) 5000 units CAPS Take 5,000 Units by mouth daily.   Yes [provider]  ibuprofen (ADVIL,MOTRIN) 200 MG tablet Take 400 mg by mouth every 6 (six) hours as needed for headache, mild pain or moderate pain.   Yes [provider]  Probiotic Product (PROBIOTIC PO) Take 1 tablet by mouth daily.   Yes [provider]  valACYclovir (VALTREX) 500 MG tablet Take 500 mg by mouth daily.   Yes [provider]    Family History  Family History  Problem Relation Age of Onset  . Hypertension Mother   . Macular degeneration Mother   . Cataracts Mother   . Heart attack Father   . Diabetes Mellitus II Father   . Heart attack Sister        two MI's and has two stents     Family Status  Relation Status  . Mother Alive  . Father Deceased  . Sister Alive     Social History  Social History   Social History  . Marital status: Married    Spouse name: N/A  . Number of children: N/A  . Years of education: N/A   Occupational History  . Not on file.   Social History Main Topics  . Smoking status: Never Smoker  . Smokeless tobacco: Never Used  . Alcohol use No  . Drug use: No  . Sexual activity: Not on file   Other Topics Concern  . Not on file   Social History Narrative  . No narrative on file     Review of Systems General:  No chills, fever, night sweats or weight changes.  Cardiovascular: No dyspnea on exertion,  edema, orthopnea, palpitations, paroxysmal nocturnal dyspnea. Dermatological: No rash, lesions/masses Respiratory: No cough, dyspnea Urologic: No hematuria, dysuria Abdominal:   No nausea, vomiting, diarrhea, bright red blood per rectum, melena, or hematemesis Neurologic:  No visual changes, wkns, changes in mental status. All other systems reviewed and are otherwise negative except as noted above.  Physical Exam  Blood pressure 120/71, pulse 62, resp. rate 13, height 5\' 4"  (1.626 m), weight 140 lb (63.5 kg), SpO2 96 %.  General: Pleasant, NAD Psych: Normal affect. Neuro: Alert and oriented X 3. Moves all extremities spontaneously. HEENT: Normal  Neck: Supple without bruits or JVD. Lungs:  Resp regular and unlabored, CTA. Heart: RRR no s3, s4, or murmurs. Abdomen: Soft, non-tender, non-distended, BS + x 4.  Extremities: No clubbing, cyanosis or edema. DP/PT/Radials 2+ and equal bilaterally.  Labs  No results for input(s): CKTOTAL, CKMB, TROPONINI in the last 72 hours. Lab Results  Component Value Date   WBC 10.0 05/28/2017   HGB 14.5 05/28/2017   HCT 41.7 05/28/2017   MCV 91.9 05/28/2017   PLT 238 05/28/2017     Recent Labs Lab 05/28/17 0527  NA 137  K 3.6  CL 101  CO2 27  BUN 16  CREATININE 0.75  CALCIUM 9.2  GLUCOSE 134*   No results found for: CHOL, HDL, LDLCALC, TRIG No results found for: DDIMER   Radiology/Studies  Dg Chest 2 View  Result Date: 05/28/2017 CLINICAL DATA:  66 year old female with chest pain. EXAM: CHEST  2 VIEW COMPARISON:  Chest radiograph dated 05/14/2016 FINDINGS: The heart size and mediastinal contours are within normal limits. Both lungs are clear. The visualized skeletal structures are unremarkable. IMPRESSION: No active cardiopulmonary disease. Electronically Signed   By: Elgie Collard M.D.   On: 05/28/2017 06:24    ECG  SR, HR 56, minimal ST depression in anterolateral leads  ASSESSMENT AND PLAN  1. Chest pain with elevated  troponin (NSTEMI): Her symptoms have been ongoing for the past few days and significant enough to wake patient up from sleep this morning. Her symptoms are atypical in the sense of the burning to the centralized chest and that does have somewhat of a positional component to it. However, her troponin has come back mildly elevated at 0.20, she has a significant family hx of MI at a young age, some minimal EKG changes. She will require transfer to [facility] for a cardiac catheterization today. She is NPO. She has been started on IV heparin and nitroglycerin drip. She is no longer having chest pains.   The patient understands that risks included but are not limited to stroke (1 in 1000), death (1 in 1000), kidney failure [usually temporary] (  1 in 500), bleeding (1 in 200), allergic reaction [possibly serious] (1 in 200).   2. Hypertension: stable   Signed, GREENE,TIFFANY G, PA-C 05/28/2017, 8:56 AM  History and all data above reviewed.  Patient examined.  I agree with the findings as above.  The patient presents with shoulder pain.  She some of this about a week ago but the more noticeable symptoms started on Sunday.  She had pain off and on again Monday and it woke her early this morning.   There has been pain between the shoulder blades and some burning of her chest.  She presented to the ED and had no relief with GI cocktail and ASA.  She did get some improvement after IVNTG and heparin.  She had no acute abnormalities on her EKG.  She did have elevated cardiac enzymes.  Her only risk factor is family history.   The patient exam reveals COR:RRR  ,  Lungs: Clear  ,  Abd: Positive bowel sounds, no rebound no guarding, Ext No edema  .  All available labs, radiology testing, previous records reviewed. Agree with documented assessment and plan. NSTEMI:  Plan his IV NTG, heparin, ASA and cath.     Cath is scheduled for later today.  We will check lipids.    Fayrene FearingJames Lindey Renzulli  2:39 PM  05/28/2017

## 2017-05-29 ENCOUNTER — Inpatient Hospital Stay (HOSPITAL_COMMUNITY): Payer: BLUE CROSS/BLUE SHIELD

## 2017-05-29 ENCOUNTER — Telehealth: Payer: Self-pay | Admitting: Interventional Cardiology

## 2017-05-29 ENCOUNTER — Ambulatory Visit (HOSPITAL_COMMUNITY): Payer: Managed Care, Other (non HMO)

## 2017-05-29 ENCOUNTER — Encounter (HOSPITAL_COMMUNITY): Payer: Self-pay | Admitting: Cardiology

## 2017-05-29 ENCOUNTER — Telehealth: Payer: Self-pay

## 2017-05-29 DIAGNOSIS — I4729 Other ventricular tachycardia: Secondary | ICD-10-CM

## 2017-05-29 DIAGNOSIS — I351 Nonrheumatic aortic (valve) insufficiency: Secondary | ICD-10-CM

## 2017-05-29 DIAGNOSIS — I472 Ventricular tachycardia: Secondary | ICD-10-CM

## 2017-05-29 DIAGNOSIS — Z95820 Peripheral vascular angioplasty status with implants and grafts: Secondary | ICD-10-CM

## 2017-05-29 DIAGNOSIS — I214 Non-ST elevation (NSTEMI) myocardial infarction: Principal | ICD-10-CM

## 2017-05-29 DIAGNOSIS — E785 Hyperlipidemia, unspecified: Secondary | ICD-10-CM | POA: Diagnosis present

## 2017-05-29 HISTORY — DX: Other ventricular tachycardia: I47.29

## 2017-05-29 HISTORY — DX: Hyperlipidemia, unspecified: E78.5

## 2017-05-29 HISTORY — DX: Ventricular tachycardia: I47.2

## 2017-05-29 HISTORY — DX: Peripheral vascular angioplasty status with implants and grafts: Z95.820

## 2017-05-29 LAB — CBC
HCT: 37.4 % (ref 36.0–46.0)
Hemoglobin: 12.4 g/dL (ref 12.0–15.0)
MCH: 31.1 pg (ref 26.0–34.0)
MCHC: 33.2 g/dL (ref 30.0–36.0)
MCV: 93.7 fL (ref 78.0–100.0)
PLATELETS: 223 10*3/uL (ref 150–400)
RBC: 3.99 MIL/uL (ref 3.87–5.11)
RDW: 13.4 % (ref 11.5–15.5)
WBC: 8.4 10*3/uL (ref 4.0–10.5)

## 2017-05-29 LAB — COMPREHENSIVE METABOLIC PANEL
ALT: 22 U/L (ref 14–54)
ANION GAP: 5 (ref 5–15)
AST: 82 U/L — AB (ref 15–41)
Albumin: 3.4 g/dL — ABNORMAL LOW (ref 3.5–5.0)
Alkaline Phosphatase: 49 U/L (ref 38–126)
BUN: 9 mg/dL (ref 6–20)
CHLORIDE: 108 mmol/L (ref 101–111)
CO2: 25 mmol/L (ref 22–32)
Calcium: 8.8 mg/dL — ABNORMAL LOW (ref 8.9–10.3)
Creatinine, Ser: 0.74 mg/dL (ref 0.44–1.00)
Glucose, Bld: 105 mg/dL — ABNORMAL HIGH (ref 65–99)
POTASSIUM: 3.5 mmol/L (ref 3.5–5.1)
Sodium: 138 mmol/L (ref 135–145)
Total Bilirubin: 1.3 mg/dL — ABNORMAL HIGH (ref 0.3–1.2)
Total Protein: 6.3 g/dL — ABNORMAL LOW (ref 6.5–8.1)

## 2017-05-29 LAB — LIPID PANEL
CHOL/HDL RATIO: 4.8 ratio
CHOLESTEROL: 164 mg/dL (ref 0–200)
HDL: 34 mg/dL — AB (ref >40)
LDL Cholesterol: 105 mg/dL — ABNORMAL HIGH (ref 0–99)
TRIGLYCERIDES: 127 mg/dL (ref <150)
VLDL: 25 mg/dL (ref 0–40)

## 2017-05-29 LAB — ECHOCARDIOGRAM COMPLETE
Height: 64 in
WEIGHTICAEL: 2186.96 [oz_av]

## 2017-05-29 LAB — HIV ANTIBODY (ROUTINE TESTING W REFLEX): HIV SCREEN 4TH GENERATION: NONREACTIVE

## 2017-05-29 MED ORDER — TICAGRELOR 90 MG PO TABS: 90.0000 mg | ORAL_TABLET | Freq: Two times a day (BID) | ORAL | 11 refills | Status: DC

## 2017-05-29 MED ORDER — METOPROLOL SUCCINATE ER 25 MG PO TB24: 25.0000 mg | ORAL_TABLET | Freq: Every day | ORAL | 6 refills | Status: DC

## 2017-05-29 MED ORDER — ATORVASTATIN CALCIUM 80 MG PO TABS: 80.0000 mg | ORAL_TABLET | Freq: Every day | ORAL | 6 refills | Status: DC

## 2017-05-29 MED ORDER — ACETAMINOPHEN 325 MG PO TABS: 650.0000 mg | ORAL_TABLET | ORAL | Status: AC | PRN

## 2017-05-29 MED ORDER — ASPIRIN 81 MG PO CHEW: 81.0000 mg | CHEWABLE_TABLET | Freq: Every day | ORAL | Status: AC

## 2017-05-29 MED ORDER — NITROGLYCERIN 0.4 MG SL SUBL: 0.4000 mg | SUBLINGUAL_TABLET | SUBLINGUAL | 4 refills | Status: DC | PRN

## 2017-05-29 NOTE — Progress Notes (Signed)
CARDIAC REHAB PHASE I   PRE:  Rate/Rhythm: 87 SR  BP:  Supine:   Sitting: 133/49  Standing:    SaO2:   MODE:  Ambulation: 500 ft   POST:  Rate/Rhythm: 90 SR  BP:  Supine: 144/69  Sitting:   Standing:    SaO2: 100%RA 0805-0920 Pt walked 500 ft with steady gait. Received tylenol for back pain. Denied CP. MI education completed with pt and husband who had many questions. Stressed importance of brilinta with stent. Reviewed NTG use, risk factors, heart healthy diet, ex ed, CRP2. [facility]. Discussed reasoning for statin. Pt seems hesitant to take. Needs to see case manager re brilinta.   Luetta Nuttingharlene Lura Falor, RN BSN  05/29/2017 9:18 AM

## 2017-05-29 NOTE — Discharge Summary (Signed)
Discharge Summary    Patient ID: Maria Lamb,  MRN: [id number], DOB/AGE: 64/12/1950 64 y.o.  Admit date: 05/28/2017 Discharge date: 05/29/2017  Primary Care Provider: Lupita RaiderShaw, Kimberlee Primary Cardiologist: Dr. Katrinka BlazingSmith  Discharge Diagnoses    Principal Problem:   NSTEMI (non-ST elevated myocardial infarction) [facility](HCC) Active Problems:   S/P angioplasty with stent, LCX 05/28/17 with DES   ACS (acute coronary syndrome) (HCC)   HLD (hyperlipidemia)   NSVT (nonsustained ventricular tachycardia) (HCC)   Allergies Allergies  Allergen Reactions  . Lisinopril Other (See Comments)    Lightheaded/fatigue  . Sulfa Antibiotics     White coating under the tongue    Diagnostic Studies/Procedures    Cardiac Cath 05/28/17 Procedures   Coronary Stent Intervention  Left Heart Cath and Coronary Angiography  Conclusion     Prox LAD lesion, 30 %stenosed.  Ramus lesion, 40 %stenosed.  Mid RCA lesion, 40 %stenosed.  The left ventricular systolic function is normal.  LV end diastolic pressure is normal.  The left ventricular ejection fraction is 50-55% by visual estimate.  Prox Cx lesion, 100 %stenosed.  A STENT SYNERGY DES 3X24 drug eluting stent was successfully placed.  Post intervention, there is a 0% residual stenosis.   1. Single vessel occlusive CAD 2. Low normal LV function 3. Normal LVEDP 4. Successful stenting of the proximal LCx with DES.  Plan; DAPT for one year. Risk factor modification.   ECHO  05/29/17  Study Conclusions  - Left ventricle: The cavity size was normal. There was mild   concentric hypertrophy. Systolic function was normal. The   estimated ejection fraction was in the range of 60% to 65%. Wall   motion was normal; there were no regional wall motion   abnormalities. Doppler parameters are consistent with abnormal   left ventricular relaxation (grade 1 diastolic dysfunction).   There was no evidence of elevated ventricular filling pressure  by   Doppler parameters. - Aortic valve: There was mild regurgitation. - Aortic root: The aortic root was normal in size. - Left atrium: The atrium was normal in size. - Right ventricle: Systolic function was normal. - Tricuspid valve: There was no regurgitation. - Pulmonary arteries: Systolic pressure could not be accurately   estimated. - Inferior vena cava: The vessel was normal in size. - Pericardium, extracardiac: There was no pericardial effusion. _____________   History of Present Illness      66 y.o. female with a history of anxiety, HSV infection, hypertension, psoriasis presented to the ER with centralized chest pain that woke her out of her sleep this morning around 3 am.  She has not had problems with chest pain in the past, she saw Dr. Mendel RyderH. Smith > 10 years ago and reports having a nuclear stress test that she was told was normal. Per chart review she apparently was seen in the ED many years ago for chest pain. She sometimes gets pains to her body that are abnormal because of her psoriasis and HSV. Last week she had burning to her upper back intrascapular region that mildly radiated into her chest. It eventually went away after taking multiple doses of medications: Naproxen and valacyclovir. She  Had pain again over the weekend but it was very vague, she took medications again and it improved  05/28/17  AM at 3 AM she had a central sternal pain that she describes as a burning, different then the other episodes of pain that woke her up out of her sleep. This concerned  her enough that she got up to get ready and presented to the emergency department. Her troponin was +  Other labs stable. She was admitted and scheduled for cardiac cath, IV heparin and NTG were started.     Hospital Course     Consultants: none   Pt [facility] Cath lab and had cath with results above.  She had PCI as above.  She is on BB, statin, ASA, Brilinta.  We would like her to attend cardiac rehab.   She was seen and evaluated by Dr. Antoine Poche and Echo was done with normal EF.  He found her stable for discharge.    She will have TCM  Appt.   _____________  Discharge Vitals Blood pressure 127/75, pulse (!) 54, temperature 98.2 F (36.8 C), temperature source Oral, resp. rate 11, height 5\' 4"  (1.626 m), weight 136 lb 11 oz (62 kg), SpO2 99 %.  Filed Weights   05/28/17 0615 05/29/17 0422  Weight: 140 lb (63.5 kg) 136 lb 11 oz (62 kg)    Labs & Radiologic Studies    CBC  Recent Labs  05/28/17 0527 05/29/17 0333  WBC 10.0 8.4  NEUTROABS 6.9  --   HGB 14.5 12.4  HCT 41.7 37.4  MCV 91.9 93.7  PLT 238 223   Basic Metabolic Panel  Recent Labs  05/28/17 0527 05/29/17 0333  NA 137 138  K 3.6 3.5  CL 101 108  CO2 27 25  GLUCOSE 134* 105*  BUN 16 9  CREATININE 0.75 0.74  CALCIUM 9.2 8.8*   Liver Function Tests  Recent Labs  05/29/17 0333  AST 82*  ALT 22  ALKPHOS 49  BILITOT 1.3*  PROT 6.3*  ALBUMIN 3.4*   No results for input(s): LIPASE, AMYLASE in the last 72 hours. Cardiac Enzymes  Recent Labs  05/28/17 1329  TROPONINI 2.22*   BNP Invalid input(s): POCBNP D-Dimer No results for input(s): DDIMER in the last 72 hours. Hemoglobin A1C No results for input(s): HGBA1C in the last 72 hours. Fasting Lipid Panel  Recent Labs  05/29/17 0333  CHOL 164  HDL 34*  LDLCALC 105*  TRIG 127  CHOLHDL 4.8   Thyroid Function Tests No results for input(s): TSH, T4TOTAL, T3FREE, THYROIDAB in the last 72 hours.  Invalid input(s): FREET3 _____________  Dg Chest 2 View  Result Date: 05/28/2017 CLINICAL DATA:  66 year old female with chest pain. EXAM: CHEST  2 VIEW COMPARISON:  Chest radiograph dated 05/14/2016 FINDINGS: The heart size and mediastinal contours are within normal limits. Both lungs are clear. The visualized skeletal structures are unremarkable. IMPRESSION: No active cardiopulmonary disease. Electronically Signed   By: Elgie Collard M.D.   On:  05/28/2017 06:24   Disposition   Pt is being discharged home today in good condition.  Follow-up Plans & Appointments    Follow-up Information    Lyn Records, MD Follow up on 06/07/2017.   Specialty:  Cardiology Why:  at 2:00 PM with [name], NP-C Contact information: [address] [place] [address] [phone]          Discharge Instructions    AMB Referral to Cardiac Rehabilitation - Phase II    Complete by:  As directed    Diagnosis:  NSTEMI   Amb Referral to Cardiac Rehabilitation    Complete by:  As directed    Diagnosis:   NSTEMI Coronary Stents       Call [facility] at [phone] if any bleeding, swelling or drainage at cath site.  May shower, no tub baths for 48 hours for groin sticks. No lifting over 5 pounds for 5 days.  No Driving for 5 days  Take 1 NTG, under your tongue, while sitting.  If no relief of pain may repeat NTG, one tab every 5 minutes up to 3 tablets total over 15 minutes.  If no relief CALL 911.  If you have dizziness/lightheadness  while taking NTG, stop taking and call 911.        Heart Healthy Diet  No work for 1 week  Call if any questions or problems   Discharge Medications   Current Discharge Medication List    START taking these medications   Details  acetaminophen (TYLENOL) 325 MG tablet Take 2 tablets (650 mg total) by mouth every 4 (four) hours as needed for headache or mild pain.    aspirin 81 MG chewable tablet Chew 1 tablet (81 mg total) by mouth daily.    atorvastatin (LIPITOR) 80 MG tablet Take 1 tablet (80 mg total) by mouth daily at 6 PM. Qty: 30 tablet, Refills: 6    metoprolol succinate (TOPROL-XL) 25 MG 24 hr tablet Take 1 tablet (25 mg total) by mouth daily. Qty: 30 tablet, Refills: 6    nitroGLYCERIN (NITROSTAT) 0.4 MG SL tablet Place 1 tablet (0.4 mg total) under the tongue every 5 (five) minutes x 3 doses as needed for chest pain. Qty: 25 tablet,  Refills: 4    ticagrelor (BRILINTA) 90 MG TABS tablet Take 1 tablet (90 mg total) by mouth 2 (two) times daily. Qty: 60 tablet, Refills: 11      CONTINUE these medications which have NOT CHANGED   Details  Cholecalciferol (VITAMIN D3) 5000 units CAPS Take 5,000 Units by mouth daily.    Probiotic Product (PROBIOTIC PO) Take 1 tablet by mouth daily.    valACYclovir (VALTREX) 500 MG tablet Take 500 mg by mouth daily.      STOP taking these medications     aspirin EC 81 MG tablet      ibuprofen (ADVIL,MOTRIN) 200 MG tablet          Aspirin prescribed at discharge?  Yes High Intensity Statin Prescribed? (Lipitor 40-80mg  or Crestor 20-40mg ): Yes Beta Blocker Prescribed? Yes For EF <40%, was ACEI/ARB Prescribed? No: na ADP Receptor Inhibitor Prescribed? (i.e. Plavix etc.-Includes Medically Managed Patients): Yes For EF <40%, Aldosterone Inhibitor Prescribed? No: na Was EF assessed during THIS hospitalization? Yes Was Cardiac Rehab II ordered? (Included Medically managed Patients): Yes   Outstanding Labs/Studies     Duration of Discharge Encounter   Greater than 30 minutes including physician time.  Signed, [name] NP 05/29/2017, 3:32 PM  Patient seen and examined.  Plan as discussed in my rounding note for today and outlined above. Maria Lamb [facility]  05/29/2017  4:02 PM

## 2017-05-29 NOTE — Progress Notes (Signed)
TR BAND REMOVAL  LOCATION:    right radial  DEFLATED PER PROTOCOL:    Yes.    TIME BAND OFF / DRESSING APPLIED:    2330    SITE UPON ARRIVAL:    Level 0  SITE AFTER BAND REMOVAL:    Level 0  CIRCULATION SENSATION AND MOVEMENT:    Within Normal Limits   Yes.    COMMENTS:   Radial site teaching reinforced, teach back done.

## 2017-05-29 NOTE — Telephone Encounter (Signed)
New message     TOC appt made per Vernona RiegerLaura with Nada BoozerLaura Ingold on 7/27.

## 2017-05-29 NOTE — Progress Notes (Addendum)
Progress Note  Patient Name: Maria Lamb Date of Encounter: 05/29/2017  Primary Cardiologist: Dr. Daiva NakayamaJ. Jabez Lamb   Subjective   No chest pain but some upper back pain.  No SOB.    Inpatient Medications    Scheduled Meds: . aspirin  81 mg Oral Daily  . atorvastatin  80 mg Oral q1800  . cholecalciferol  5,000 Units Oral Daily  . metoprolol succinate  25 mg Oral Daily  . sodium chloride flush  3 mL Intravenous Q12H  . sodium chloride flush  3 mL Intravenous Q12H  . ticagrelor  90 mg Oral BID   Continuous Infusions: . sodium chloride    . nitroGLYCERIN Stopped (05/28/17 2209)   PRN Meds: sodium chloride, acetaminophen, nitroGLYCERIN, ondansetron (ZOFRAN) IV, sodium chloride flush, sodium chloride flush   Vital Signs    Vitals:   05/28/17 2205 05/29/17 0200 05/29/17 0422 05/29/17 0800  BP: 112/60 (!) 139/51  (!) 133/49  Pulse: 80 75  75  Resp:  13  12  Temp:  97.6 F (36.4 C)  97.8 F (36.6 C)  TempSrc:  Oral  Oral  SpO2:  98%  99%  Weight:   136 lb 11 oz (62 kg)   Height:        Intake/Output Summary (Last 24 hours) at 05/29/17 0856 Last data filed at 05/29/17 0700  Gross per 24 hour  Intake          2692.53 ml  Output             1800 ml  Net           892.53 ml   Filed Weights   05/28/17 0615 05/29/17 0422  Weight: 140 lb (63.5 kg) 136 lb 11 oz (62 kg)    Telemetry    7 beats of NSVT early this AM  - Personally Reviewed  ECG    SR with flatter T waves lat leads - Personally Reviewed  Physical Exam   GEN: No acute distress.   Neck: No JVD Cardiac: RRR, no murmurs, rubs, or gallops.  Respiratory: Clear to auscultation bilaterally. GI: Soft, nontender, non-distended< + BS  MS: No edema; No deformity. Back with shingles Neuro:  Nonfocal  Psych: Normal affect   Labs    Chemistry Recent Labs Lab 05/28/17 0527 05/29/17 0333  NA 137 138  K 3.6 3.5  CL 101 108  CO2 27 25  GLUCOSE 134* 105*  BUN 16 9  CREATININE 0.75 0.74  CALCIUM 9.2  8.8*  PROT  --  6.3*  ALBUMIN  --  3.4*  AST  --  82*  ALT  --  22  ALKPHOS  --  49  BILITOT  --  1.3*  GFRNONAA >60 >60  GFRAA >60 >60  ANIONGAP 9 5     Hematology Recent Labs Lab 05/28/17 0527 05/29/17 0333  WBC 10.0 8.4  RBC 4.54 3.99  HGB 14.5 12.4  HCT 41.7 37.4  MCV 91.9 93.7  MCH 31.9 31.1  MCHC 34.8 33.2  RDW 13.0 13.4  PLT 238 223    Cardiac Enzymes Recent Labs Lab 05/28/17 1329  TROPONINI 2.22*    Recent Labs Lab 05/28/17 0547  TROPIPOC 0.21*     BNPNo results for input(s): BNP, PROBNP in the last 168 hours.   DDimer No results for input(s): DDIMER in the last 168 hours.   Radiology    Dg Chest 2 View  Result Date: 05/28/2017 CLINICAL DATA:  66 year old female with  chest pain. EXAM: CHEST  2 VIEW COMPARISON:  Chest radiograph dated 05/14/2016 FINDINGS: The heart size and mediastinal contours are within normal limits. Both lungs are clear. The visualized skeletal structures are unremarkable. IMPRESSION: No active cardiopulmonary disease. Electronically Signed   By: Elgie Collard M.D.   On: 05/28/2017 06:24    Cardiac Studies   Cardiac cath 05/29/17 Procedures   Coronary Stent Intervention  Left Heart Cath and Coronary Angiography  Conclusion     Prox LAD lesion, 30 %stenosed.  Ramus lesion, 40 %stenosed.  Mid RCA lesion, 40 %stenosed.  The left ventricular systolic function is normal.  LV end diastolic pressure is normal.  The left ventricular ejection fraction is 50-55% by visual estimate.  Prox Cx lesion, 100 %stenosed.  A STENT SYNERGY DES 3X24 drug eluting stent was successfully placed.  Post intervention, there is a 0% residual stenosis.   1. Single vessel occlusive CAD 2. Low normal LV function 3. Normal LVEDP 4. Successful stenting of the proximal LCx with DES.  Plan; DAPT for one year. Risk factor modification.      Patient Profile     66 y.o. female  a history of anxiety, hypertension, psoriasis  presented to the ER with centralized chest pain that woke her out of her sleep 05/28/17  around 3 am.  +NSTEMI and now with stent to LCX. DES synergy.  Assessment & Plan    NSTEMI  With pk troponin of 2.22 at 1300 yesterday.  DAPT for 1 year  On Brilinta; on BB  --walked with cardiac rehab without complications.   --no chest pain + upper back pain   S/P stent synergy DES to LCX.    CAD with residual NON obstructive disease in LAD, Ramus and RCA  Normal EF.  HLD goal now for LDL is 70 --on lipitor 80 mg daily  NSVT 7 beats around MN     Signed, Maria Boozer, NP  05/29/2017, 8:56 AM    History and all data above reviewed.  Patient examined.  I agree with the findings as above. The patient denies any new symptoms such as chest discomfort, neck or arm discomfort. There has been no new shortness of breath, PND or orthopnea. There have been no reported palpitations, presyncope or syncope.  She is having back pain but this is unlike her presenting complaint.   The patient exam reveals COR:RRR  ,  Lungs: Clear  ,  Abd: Positive bowel sounds, no rebound no guarding, Ext No edema, right wrist with mild echymosis  .  All available labs, radiology testing, previous records reviewed. Agree with documented assessment and plan. NSTEMI:  Status post DES.  Non obstructive disease elsewhere.  Discussed secondary risk reduction in detail.  Check echo before discharge.  NSVT noted but preserved EF.  Continue beta blocker.  Discharge later today.    Maria Lamb  10:10 AM  05/29/2017

## 2017-05-29 NOTE — Care Management Note (Addendum)
Case Management Note  Patient Details  Name: Maria Lamb MRN: [id number] Date of Birth: 01/22/1951  Subjective/Objective:   From home with spouse, pta indep s/p stent intervention, will be on brilinta, NCM gave her the $5 co pay card for the brilinta, awaiting benefit check to see if she has a co pay.,  She states [facility], they do have brilinta in stock, she also states [facility], they are jellied shoes ,multicolored size 6 or 7, NCM [facility] to see if they have the shoes. They could not find the shoes .   I gave patient the phone number for [phone] to call and check.  Patient states they may be in the cath lab.  Her spouse found her shoes.                Action/Plan: NCM will follow for dc needs.   Expected Discharge Date:   (unknown)               Expected Discharge Plan:  Home/Self Care  In-House Referral:     Discharge planning Services  CM Consult  Post Acute Care Choice:    Choice offered to:     DME Arranged:    DME Agency:     HH Arranged:    HH Agency:     Status of Service:  Completed, signed off  If discussed at [facility] Length of Stay Meetings, dates discussed:    Additional Comments:  Leone Havenaylor, Noorah Giammona [facility], RN 05/29/2017, 9:36 AM

## 2017-05-29 NOTE — Progress Notes (Addendum)
#  2. S/W [facility] @ [place] / [facility] # [phone]   BRILINTA 90 MG BID   COVER- YES  CO-PAY- $ 70.00  PRIOR APPROVAL- NO   PREFERRED PHARMACY : [facility], [facility] AND [facility]   Has $5 co pay card, as long as deductible has been met will only pay $5.

## 2017-05-29 NOTE — Progress Notes (Signed)
  Echocardiogram 2D Echocardiogram has been performed.  [name] 05/29/2017, 1:27 PM

## 2017-05-29 NOTE — Discharge Instructions (Signed)
Call [facility] [address] at [phone] if any bleeding, swelling or drainage at cath site.  May shower, no tub baths for 48 hours for groin sticks. No lifting over 5 pounds for 3 days.  No Driving for 5 days  No Work for 1 week   Take 1 NTG, under your tongue, while sitting.  If no relief of pain may repeat NTG, one tab every 5 minutes up to 3 tablets total over 15 minutes.  If no relief CALL 911.  If you have dizziness/lightheadness  while taking NTG, stop taking and call 911.        Heart Healthy Diet  Call if any questions or problems   Do not stop Brilinta or asprin

## 2017-05-31 ENCOUNTER — Telehealth (HOSPITAL_COMMUNITY): Payer: Self-pay

## 2017-05-31 NOTE — Telephone Encounter (Signed)
Call placed to Pt for TCM f/u.  Call went to VM.  No DPR on file.  Left message requesting call back.

## 2017-05-31 NOTE — Telephone Encounter (Signed)
Patient insurance is active and benefits verified. Patient insurance is [employer] - no co-payment, deductible $900/$502 has been met, out of pocket $5000/$692 has been met, 20% co-insurance, no pre-authorization and no limit on visit. Passport/reference [phone].   Patient will be contacted and scheduled after their follow up visit with the cardiologist office on 06/07/17, upon review by [facility] RN navigator.

## 2017-06-02 ENCOUNTER — Telehealth: Payer: Self-pay | Admitting: Cardiology

## 2017-06-02 NOTE — Telephone Encounter (Signed)
Pt call reporting significant GI upset and feels that it may be related to her Lipitor 80mg  daily. Instructed that it was ok to hold dose today, and reduce back to 40mg  daily tomorrow to see if symptoms improve. Has follow up arranged later this week, but instructed to call if needed sooner. Patient voiced understanding and thanked me for follow up call.   Laverda PageLindsay Roberts NP

## 2017-06-03 NOTE — Telephone Encounter (Signed)
Pt c/o medication issue:  1. Name of Medication: Atorvastatin   2. How are you currently taking this medication (dosage and times per day)? 80mg  1xday 3. Are you having a reaction (difficulty breathing--STAT)? no 4. What is your medication issue? diarrhea lost 2 pounds in 24 hours

## 2017-06-03 NOTE — Telephone Encounter (Signed)
2nd outreach made to Pt. Left information for f/u appt on VM- appt 06/07/2017 @ 1400 [facility]. Left office # for call back if Pt has any questions.

## 2017-06-04 ENCOUNTER — Telehealth: Payer: Self-pay | Admitting: Interventional Cardiology

## 2017-06-04 NOTE — Telephone Encounter (Signed)
This was addressed in phone note by Laverda PageLindsay Roberts, PA to decrease atorvastatin back to 40mg  daily.

## 2017-06-04 NOTE — Telephone Encounter (Signed)
Pt states she dropped 2lbs Saturday because she developed diarrhea that she feels was d/t Atorvastatin.  She spoke with on call 7/22 and was advised to hold that days dose and then start 40mg  QD Monday if sx improved.  Pt calling today because she wanted to know what she needs to do because she has not started Atorvastatin back at all at this time.  Diarrhea did resolve.  Advised pt to take Atorvastatin 40mg  QD and see if she does better at this dose.  Advised if diarrhea develops ok to hold until she is seen on Friday.  Pt verbalized understanding and was in agreement with this plan.

## 2017-06-04 NOTE — Telephone Encounter (Signed)
New message    Pt is calling to follow up from message from yesterday about her medication.  Pt c/o medication issue:  1. Name of Medication: atorvastatin   2. How are you currently taking this medication (dosage and times per day)? 80 mg  3. Are you having a reaction (difficulty breathing--STAT)? no  4. What is your medication issue? Pt is having problems. She is wanting a call back today.

## 2017-06-07 ENCOUNTER — Ambulatory Visit (INDEPENDENT_AMBULATORY_CARE_PROVIDER_SITE_OTHER): Payer: BLUE CROSS/BLUE SHIELD | Admitting: Cardiology

## 2017-06-07 ENCOUNTER — Encounter: Payer: Self-pay | Admitting: Cardiology

## 2017-06-07 VITALS — BP 120/84 | HR 61 | Ht 64.0 in | Wt 150.8 lb

## 2017-06-07 DIAGNOSIS — I214 Non-ST elevation (NSTEMI) myocardial infarction: Secondary | ICD-10-CM

## 2017-06-07 DIAGNOSIS — I2510 Atherosclerotic heart disease of native coronary artery without angina pectoris: Secondary | ICD-10-CM | POA: Diagnosis not present

## 2017-06-07 DIAGNOSIS — E782 Mixed hyperlipidemia: Secondary | ICD-10-CM

## 2017-06-07 DIAGNOSIS — Z959 Presence of cardiac and vascular implant and graft, unspecified: Secondary | ICD-10-CM

## 2017-06-07 DIAGNOSIS — Z95820 Peripheral vascular angioplasty status with implants and grafts: Secondary | ICD-10-CM

## 2017-06-07 NOTE — Patient Instructions (Signed)
Medication Instructions:  Your physician recommends that you continue on your current medications as directed. Please refer to the Current Medication list given to you today.   Labwork: -None  Testing/Procedures: -None  Follow-Up: Your physician recommends that you keep your scheduled  follow-up appointment with Dr. Smith.   Any Other Special Instructions Will Be Listed Below (If Applicable).     If you need a refill on your cardiac medications before your next appointment, please call your pharmacy.   

## 2017-06-07 NOTE — Progress Notes (Signed)
Cardiology Office Note   Date:  06/09/2017   ID:  Maria Lamb, DOB 06/12/1951, MRN [id number]  PCP:  Lupita RaiderShaw, Kimberlee, MD  Cardiologist:  Dr. Katrinka BlazingSmith    Chief Complaint  Patient presents with  . Hospitalization Follow-up      History of Present Illness: Maria Lamb is a 66 y.o. female who presents for post hospital for NSTEMI and stent to LCX with DES.   TCM appt. history of anxiety, HSV infection, hypertension, psoriasis presented to the ER with centralized chest pain that woke her out of her sleep and she presented to ER with elevated troponin.  pk troponin 2.22.  Echo with EF 60-65%, G1DD.  Mild AR  She did well and today she has no chest pain.  She tells me today she she never had chest pain but burning between her shoulder blades.  She has had some of this but resolved.  She will let us know if this increases.  She has had some SOB but not severe.  We discussed that if SOB increases to laet us know it could be Brilinta.  Usually it resolves but if not we can change.  She also had diarrhea with high dose of Lipitor.  She called and decreased to 20 mg then she is willing to try 40 mg but if symptoms return we will switch to Crestor 20 mg.   She is walking and plans to go to Cardiac rehab.  She is eating healthy.  Her PCP will recheck lipids in [place].  She is going to stay out of work another week.      Past Medical History:  Diagnosis Date  . Anxiety   . HLD (hyperlipidemia) 05/29/2017  . HSV infection   . Hypertension    Significant white coat syndrome  . NSTEMI (non-ST elevated myocardial infarction) (HCC)   . NSVT (nonsustained ventricular tachycardia) (HCC) 05/29/2017  . Psoriasis   . S/P angioplasty with stent, LCX 05/28/17 with DES 05/29/2017    Past Surgical History:  Procedure Laterality Date  . CORONARY STENT INTERVENTION N/A 05/28/2017   Procedure: Coronary Stent Intervention;  Surgeon: SwazilandJordan, Peter M, MD;  [facility];  Service:  Cardiovascular;  Laterality: N/A;  . LEFT HEART CATH AND CORONARY ANGIOGRAPHY N/A 05/28/2017   Procedure: Left Heart Cath and Coronary Angiography;  Surgeon: SwazilandJordan, Peter M, MD;  [facility];  Service: Cardiovascular;  Laterality: N/A;  . TONSILLECTOMY AND ADENOIDECTOMY       Current Outpatient Prescriptions  Medication Sig Dispense Refill  . acetaminophen (TYLENOL) 325 MG tablet Take 2 tablets (650 mg total) by mouth every 4 (four) hours as needed for headache or mild pain.    Marland Kitchen. aspirin 81 MG chewable tablet Chew 1 tablet (81 mg total) by mouth daily.    Marland Kitchen. atorvastatin (LIPITOR) 20 MG tablet Take 20 mg by mouth daily.    . Cholecalciferol (VITAMIN D3) 5000 units CAPS Take 5,000 Units by mouth daily.    . metoprolol succinate (TOPROL-XL) 25 MG 24 hr tablet Take 1 tablet (25 mg total) by mouth daily. 30 tablet 6  . nitroGLYCERIN (NITROSTAT) 0.4 MG SL tablet Place 1 tablet (0.4 mg total) under the tongue every 5 (five) minutes x 3 doses as needed for chest pain. 25 tablet 4  . Probiotic Product (PROBIOTIC PO) Take 1 tablet by mouth daily.    . ticagrelor (BRILINTA) 90 MG TABS tablet Take 1 tablet (90 mg total) by mouth  2 (two) times daily. 60 tablet 11   No current facility-administered medications for this visit.     Allergies:   Lipitor [atorvastatin]; Lisinopril; and Sulfa antibiotics    Social History:  The patient  reports that she has never smoked. She has never used smokeless tobacco. She reports that she does not drink alcohol or use drugs.   Family History:  The patient's family history includes Cataracts in her mother; Diabetes Mellitus II in her father; Heart attack (age of onset: 1064) in her father and sister; Hypertension in her mother; Macular degeneration in her mother.    ROS:  General:no colds or fevers,  weight increased  Skin:no rashes or ulcers HEENT:no blurred vision, no congestion CV:see HPI PUL:see HPI GI:+ diarrhea no constipation or melena, no  indigestion GU:no hematuria, no dysuria MS:no joint pain, no claudication Neuro:no syncope, no lightheadedness Endo:no diabetes, no thyroid disease  Wt Readings from Last 3 Encounters:  06/07/17 150 lb 12.8 oz (68.4 kg)  05/29/17 136 lb 11 oz (62 kg)  05/14/16 161 lb (73 kg)     PHYSICAL EXAM: VS:  BP 120/84 (BP Location: Left Arm, Patient Position: Sitting, Cuff Size: Normal)   Pulse 61   Ht 5\' 4"  (1.626 m)   Wt 150 lb 12.8 oz (68.4 kg)   SpO2 98%   BMI 25.88 kg/m  , BMI Body mass index is 25.88 kg/m. General:Pleasant affect, NAD Skin:Warm and dry, brisk capillary refill HEENT:normocephalic, sclera clear, mucus membranes moist Neck:supple, no JVD, no bruits  Heart:S1S2 RRR without murmur, gallup, rub or click Lungs:clear without rales, rhonchi, or wheezes ZOX:WRUEAbd:soft, non tender, + BS, do not palpate liver spleen or masses Ext:no lower ext edema, 2+ pedal pulses, 2+ radial pulses- cath site with healing bruise. Neuro:alert and oriented, MAE, follows commands, + facial symmetry    EKG:  EKG is ordered today. The ekg ordered today demonstrates SR normal EKG poor R wave progression has resolved.    Recent Labs: 05/29/2017: ALT 22; BUN 9; Creatinine, Ser 0.74; Hemoglobin 12.4; Platelets 223; Potassium 3.5; Sodium 138    Lipid Panel    Component Value Date/Time   CHOL 164 05/29/2017 0333   TRIG 127 05/29/2017 0333   HDL 34 (L) 05/29/2017 0333   CHOLHDL 4.8 05/29/2017 0333   VLDL 25 05/29/2017 0333   LDLCALC 105 (H) 05/29/2017 0333       Other studies Reviewed: Additional studies/ records that were reviewed today include: . Cardiac cath 05/28/17 Procedures   Coronary Stent Intervention  Left Heart Cath and Coronary Angiography  Conclusion     Prox LAD lesion, 30 %stenosed.  Ramus lesion, 40 %stenosed.  Mid RCA lesion, 40 %stenosed.  The left ventricular systolic function is normal.  LV end diastolic pressure is normal.  The left ventricular ejection  fraction is 50-55% by visual estimate.  Prox Cx lesion, 100 %stenosed.  A STENT SYNERGY DES 3X24 drug eluting stent was successfully placed.  Post intervention, there is a 0% residual stenosis.   1. Single vessel occlusive CAD 2. Low normal LV function 3. Normal LVEDP 4. Successful stenting of the proximal LCx with DES.  Plan; DAPT for one year. Risk factor modification.    ECHO  05/29/17 Study Conclusions  - Left ventricle: The cavity size was normal. There was mild   concentric hypertrophy. Systolic function was normal. The   estimated ejection fraction was in the range of 60% to 65%. Wall   motion was normal; there were no regional  wall motion   abnormalities. Doppler parameters are consistent with abnormal   left ventricular relaxation (grade 1 diastolic dysfunction).   There was no evidence of elevated ventricular filling pressure by   Doppler parameters. - Aortic valve: There was mild regurgitation. - Aortic root: The aortic root was normal in size. - Left atrium: The atrium was normal in size. - Right ventricle: Systolic function was normal. - Tricuspid valve: There was no regurgitation. - Pulmonary arteries: Systolic pressure could not be accurately   estimated. - Inferior vena cava: The vessel was normal in size. - Pericardium, extracardiac: There was no pericardial effusion.   ASSESSMENT AND PLAN:  1.  NSTEMI with stent DES to LCX EF is normal.  Continue DAPT for 1 year.  Follow up with Dr. Katrinka Blazing in 4-6 weeks.  2.  CAD with stent to LCX residual mild CAD   3.  HLD with goal < 70      Current medicines are reviewed with the patient today.  The patient Has no concerns regarding medicines.  The following changes have been made:  See above Labs/ tests ordered today include:see above  Disposition:   FU:  see above  Signed, [name], NP  06/09/2017 8:56 PM    [facility] [address] [place], [place], [place]  [address]/ [address]  [employer] [address] [place], [place] Phone: [phone]; Fax: [phone]  [phone]

## 2017-06-18 ENCOUNTER — Telehealth (HOSPITAL_COMMUNITY): Payer: Self-pay

## 2017-06-18 NOTE — Telephone Encounter (Signed)
I called and spoke to patient about scheduling for cardiac rehab. Patient was concerned about how much she had to pay out of pocket. Patient wanted to contact the insurance company and get more information before scheduling appointment. Patient informed me that she would contact me in a few days.

## 2017-06-25 ENCOUNTER — Telehealth: Payer: Self-pay | Admitting: Nurse Practitioner

## 2017-06-25 NOTE — Telephone Encounter (Signed)
   Pt called this evening reporting that she had some sharp and shooting pain between her shoulder blades which resolved after ntg.  This was somewhat similar to prior angina.  She is currently pain free.  I rec that if she has recurrent c/p, she should call 911 and come into the ED tonight for evaluation.  Otw, I will reach out to our office to try and move up her appt to this week.  She has pcp f/u tomorrow.  Caller verbalized understanding and was grateful for the call back.  Nicolasa Duckinghristopher Marieli Rudy, NP 06/25/2017, 7:16 PM

## 2017-06-26 NOTE — Progress Notes (Signed)
Cardiology Office Note    Date:  06/27/2017   ID:  Maria Lamb, DOB 05/13/1951, MRN [id number]  PCP:  Lupita RaiderShaw, Kimberlee, MD  Cardiologist: Lesleigh NoeHenry W Tiannah Greenly III, MD   Chief Complaint  Patient presents with  . Coronary Artery Disease    History of Present Illness:  Maria Lamb is a 66 y.o. female with history of anxiety, HSV infection, hypertension, psoriasis,NSTEMI 04/2016, treated with left circumflex DES.  Echo with EF 60-65%, G1DD.  Mild AR.  No chest discomfort. No discomfort in the interscapular region similar to her infarction pattern. She has no exertional limitations. No medication side effects. She has been under stress related to her family and caring for elders. She does not smoke.  Past Medical History:  Diagnosis Date  . Anxiety   . HLD (hyperlipidemia) 05/29/2017  . HSV infection   . Hypertension    Significant white coat syndrome  . NSTEMI (non-ST elevated myocardial infarction) (HCC)   . NSVT (nonsustained ventricular tachycardia) (HCC) 05/29/2017  . Psoriasis   . S/P angioplasty with stent, LCX 05/28/17 with DES 05/29/2017    Past Surgical History:  Procedure Laterality Date  . CORONARY STENT INTERVENTION N/A 05/28/2017   Procedure: Coronary Stent Intervention;  Surgeon: SwazilandJordan, Peter M, MD;  [facility];  Service: Cardiovascular;  Laterality: N/A;  . LEFT HEART CATH AND CORONARY ANGIOGRAPHY N/A 05/28/2017   Procedure: Left Heart Cath and Coronary Angiography;  Surgeon: SwazilandJordan, Peter M, MD;  [facility];  Service: Cardiovascular;  Laterality: N/A;  . TONSILLECTOMY AND ADENOIDECTOMY      Current Medications: Outpatient Medications Prior to Visit  Medication Sig Dispense Refill  . acetaminophen (TYLENOL) 325 MG tablet Take 2 tablets (650 mg total) by mouth every 4 (four) hours as needed for headache or mild pain.    Marland Kitchen. aspirin 81 MG chewable tablet Chew 1 tablet (81 mg total) by mouth daily.    . Cholecalciferol (VITAMIN D3)  5000 units CAPS Take 5,000 Units by mouth daily.    . metoprolol succinate (TOPROL-XL) 25 MG 24 hr tablet Take 1 tablet (25 mg total) by mouth daily. 30 tablet 6  . nitroGLYCERIN (NITROSTAT) 0.4 MG SL tablet Place 1 tablet (0.4 mg total) under the tongue every 5 (five) minutes x 3 doses as needed for chest pain. 25 tablet 4  . Probiotic Product (PROBIOTIC PO) Take 1 tablet by mouth daily.    . ticagrelor (BRILINTA) 90 MG TABS tablet Take 1 tablet (90 mg total) by mouth 2 (two) times daily. 60 tablet 11  . atorvastatin (LIPITOR) 20 MG tablet Take 20 mg by mouth daily.     No facility-administered medications prior to visit.      Allergies:   Lipitor [atorvastatin]; Lisinopril; and Sulfa antibiotics   Social History   Social History  . Marital status: Married    Spouse name: N/A  . Number of children: 2  . Years of education: N/A   Occupational History  . Social research officer, [employer]    Social History Main Topics  . Smoking status: Never Smoker  . Smokeless tobacco: Never Used  . Alcohol use No  . Drug use: No  . Sexual activity: Not Asked   Other Topics Concern  . None   Social History Narrative  . None     Family History:  The patient's family history includes Cataracts in her mother; Diabetes Mellitus II in her father; Heart attack (age of onset: 3464) in  her father and sister; Hypertension in her mother; Macular degeneration in her mother.   ROS:   Please see the history of present illness.    Shortness of breath, back neck and occipital tension/discomfort. Not precipitated by activity. Relieved by lying down. Also complains of low back pain. She gets anxious when she feels a discomfort in her neck and occipital region  All other systems reviewed and are negative.   PHYSICAL EXAM:   VS:  BP (!) 166/100 (BP Location: Right Arm)   Pulse 72   Ht 5\' 4"  (1.626 m)   Wt 150 lb (68 kg)   BMI 25.75 kg/m    GEN: Well nourished, well developed, in no acute distress  HEENT: normal  Neck:  no JVD, carotid bruits, or masses Cardiac: RRR; no murmurs, rubs, or gallops,no edema  Respiratory:  clear to auscultation bilaterally, normal work of breathing GI: soft, nontender, nondistended, + BS MS: no deformity or atrophy  Skin: warm and dry, no rash Neuro:  Alert and Oriented x 3, Strength and sensation are intact Psych: euthymic mood, full affect  Wt Readings from Last 3 Encounters:  06/27/17 150 lb (68 kg)  06/07/17 150 lb 12.8 oz (68.4 kg)  05/29/17 136 lb 11 oz (62 kg)      Studies/Labs Reviewed:   EKG:  EKG  Not repeated  Recent Labs: 05/29/2017: ALT 22; BUN 9; Creatinine, Ser 0.74; Hemoglobin 12.4; Platelets 223; Potassium 3.5; Sodium 138   Lipid Panel    Component Value Date/Time   CHOL 164 05/29/2017 0333   TRIG 127 05/29/2017 0333   HDL 34 (L) 05/29/2017 0333   CHOLHDL 4.8 05/29/2017 0333   VLDL 25 05/29/2017 0333   LDLCALC 105 (H) 05/29/2017 0333    Additional studies/ records that were reviewed today include:   Coronary angiography and PCI July 2018:  Coronary Diagrams   Diagnostic Diagram      Post-Intervention Diagram          ASSESSMENT:    1. S/P angioplasty with stent, LCX 05/28/17 with DES   2. Other hyperlipidemia   3. NSVT (nonsustained ventricular tachycardia) (HCC)   4. Old non-ST elevation MI July 2018   5. Elevated blood pressure reading without diagnosis of hypertension      PLAN:  In order of problems listed above:  1. Aerobic exercise. Encourage cardiac rehabilitation. Continue dual antiplatelet therapy until July 2018. 2. Decrease atorvastatin to 20 mg per day based on LDL cholesterol of 53 done 3 days ago at Dr. Lamar Benes office. Will need repeat liver and lipid panel in 3-4 months. 3. Not a concern. No palpitations. No further evaluation. Consider related to ischemia/post infarct irritability. 4. No clinical evidence of significant heart failure either systolic or diastolic. Postprocedure echocardiogram  demonstrated preserved LV systolic function. 5. Multiple recordings starting in spring 2017 were reviewed from digital storage that she has on her cell phone. Most blood pressures were above 140 mmHg. Diastolic pressures tend to run below 80 mmHg. There were common systolic blood pressures greater than 150. Since myocardial infarction and the addition of metoprolol her blood pressures have been much better with most running below 140 mmHg systolic. I believe she has hypertension. Her target should be 130/85 mmHg or less. We discussed this.   Clinical follow-up in 6 months. Encouraged aerobic activity. Aspirin and Brilinta should be continued for one year.  Medication Adjustments/Labs and Tests Ordered: Current medicines are reviewed at length with the patient today.  Concerns  regarding medicines are outlined above.  Medication changes, Labs and Tests ordered today are listed in the Patient Instructions below. Patient Instructions  Medication Instructions:  1) Atorvastatin 20mg  once daily.  A new prescription has been sent in to your pharmacy.  Labwork: Your physician recommends that you return for lab work in: 3 months (Lipid, liver)   Testing/Procedures: None  Follow-Up: Your physician recommends that you schedule a follow-up appointment in: 4-6 months with Dr. Katrinka Blazing.    Any Other Special Instructions Will Be Listed Below (If Applicable).     If you need a refill on your cardiac medications before your next appointment, please call your pharmacy.      Signed, Lesleigh Noe, MD  06/27/2017 12:40 PM    [facility] [address] [place], [place], [place]  [address] Phone: [phone]; Fax: [phone]

## 2017-06-27 ENCOUNTER — Ambulatory Visit (INDEPENDENT_AMBULATORY_CARE_PROVIDER_SITE_OTHER): Payer: BLUE CROSS/BLUE SHIELD | Admitting: Interventional Cardiology

## 2017-06-27 ENCOUNTER — Encounter: Payer: Self-pay | Admitting: Interventional Cardiology

## 2017-06-27 VITALS — BP 166/100 | HR 72 | Ht 64.0 in | Wt 150.0 lb

## 2017-06-27 DIAGNOSIS — I472 Ventricular tachycardia: Secondary | ICD-10-CM

## 2017-06-27 DIAGNOSIS — Z959 Presence of cardiac and vascular implant and graft, unspecified: Secondary | ICD-10-CM | POA: Diagnosis not present

## 2017-06-27 DIAGNOSIS — I252 Old myocardial infarction: Secondary | ICD-10-CM

## 2017-06-27 DIAGNOSIS — I4729 Other ventricular tachycardia: Secondary | ICD-10-CM

## 2017-06-27 DIAGNOSIS — E784 Other hyperlipidemia: Secondary | ICD-10-CM

## 2017-06-27 DIAGNOSIS — E7849 Other hyperlipidemia: Secondary | ICD-10-CM

## 2017-06-27 DIAGNOSIS — R030 Elevated blood-pressure reading, without diagnosis of hypertension: Secondary | ICD-10-CM

## 2017-06-27 DIAGNOSIS — Z95820 Peripheral vascular angioplasty status with implants and grafts: Secondary | ICD-10-CM

## 2017-06-27 MED ORDER — ATORVASTATIN CALCIUM 20 MG PO TABS: 20.0000 mg | ORAL_TABLET | Freq: Every day | ORAL | 3 refills | Status: DC

## 2017-06-27 NOTE — Patient Instructions (Signed)
Medication Instructions:  1) Atorvastatin 20mg  once daily.  A new prescription has been sent in to your pharmacy.  Labwork: Your physician recommends that you return for lab work in: 3 months (Lipid, liver)   Testing/Procedures: None  Follow-Up: Your physician recommends that you schedule a follow-up appointment in: 4-6 months with Dr. Katrinka BlazingSmith.    Any Other Special Instructions Will Be Listed Below (If Applicable).     If you need a refill on your cardiac medications before your next appointment, please call your pharmacy.

## 2017-07-22 ENCOUNTER — Other Ambulatory Visit: Payer: Self-pay | Admitting: Pharmacist

## 2017-07-22 MED ORDER — TICAGRELOR 90 MG PO TABS: 90.0000 mg | ORAL_TABLET | Freq: Two times a day (BID) | ORAL | 3 refills | Status: DC

## 2017-07-30 ENCOUNTER — Encounter: Payer: Self-pay | Admitting: Interventional Cardiology

## 2017-07-30 ENCOUNTER — Ambulatory Visit: Payer: BLUE CROSS/BLUE SHIELD | Admitting: Interventional Cardiology

## 2017-07-30 ENCOUNTER — Ambulatory Visit (INDEPENDENT_AMBULATORY_CARE_PROVIDER_SITE_OTHER): Payer: BLUE CROSS/BLUE SHIELD | Admitting: Interventional Cardiology

## 2017-07-30 VITALS — BP 136/74 | HR 78 | Ht 64.0 in | Wt 151.0 lb

## 2017-07-30 DIAGNOSIS — Z959 Presence of cardiac and vascular implant and graft, unspecified: Secondary | ICD-10-CM

## 2017-07-30 DIAGNOSIS — R0600 Dyspnea, unspecified: Secondary | ICD-10-CM | POA: Diagnosis not present

## 2017-07-30 DIAGNOSIS — I10 Essential (primary) hypertension: Secondary | ICD-10-CM

## 2017-07-30 DIAGNOSIS — I2510 Atherosclerotic heart disease of native coronary artery without angina pectoris: Secondary | ICD-10-CM | POA: Diagnosis not present

## 2017-07-30 DIAGNOSIS — E784 Other hyperlipidemia: Secondary | ICD-10-CM | POA: Diagnosis not present

## 2017-07-30 DIAGNOSIS — E7849 Other hyperlipidemia: Secondary | ICD-10-CM

## 2017-07-30 DIAGNOSIS — Z95820 Peripheral vascular angioplasty status with implants and grafts: Secondary | ICD-10-CM

## 2017-07-30 MED ORDER — METOPROLOL SUCCINATE ER 50 MG PO TB24: 50.0000 mg | ORAL_TABLET | Freq: Every day | ORAL | 3 refills | Status: DC

## 2017-07-30 NOTE — Progress Notes (Signed)
Cardiology Office Note    Date:  07/30/2017   ID:  Maria Lamb, DOB Jun 19, 1951, MRN [id number]  PCP:  Lupita Raider, MD  Cardiologist: Lesleigh Noe, MD   Chief Complaint  Patient presents with  . Coronary Artery Disease    History of Present Illness:  Maria Lamb is a 66 y.o. female with history of acute coronary syndrome requiring circumflex stent, hyperlipidemia, and hypertension.  Current complaints include interscapular discomfort at variable times. One month ago she used sublingual nitroglycerin 2 for a nagging interscapular discomfort. Occasionally with exertion she will have interscapular discomfort. There is no chest discomfort.  Nagging sense of dyspnea. It is worse after she takes Brilinta. No orthopnea, PND, or edema.  Concern about blood pressure which tends to run greater than 130 mmHg systolic.  Past Medical History:  Diagnosis Date  . Anxiety   . HLD (hyperlipidemia) 05/29/2017  . HSV infection   . Hypertension    Significant white coat syndrome  . NSTEMI (non-ST elevated myocardial infarction) (HCC)   . NSVT (nonsustained ventricular tachycardia) (HCC) 05/29/2017  . Psoriasis   . S/P angioplasty with stent, LCX 05/28/17 with DES 05/29/2017    Past Surgical History:  Procedure Laterality Date  . CORONARY STENT INTERVENTION N/A 05/28/2017   Procedure: Coronary Stent Intervention;  Surgeon: Swaziland, Peter M, MD;  Location: [facility] INVASIVE CV LAB;  Service: Cardiovascular;  Laterality: N/A;  . LEFT HEART CATH AND CORONARY ANGIOGRAPHY N/A 05/28/2017   Procedure: Left Heart Cath and Coronary Angiography;  Surgeon: Swaziland, Peter M, MD;  Location: [facility] INVASIVE CV LAB;  Service: Cardiovascular;  Laterality: N/A;  . TONSILLECTOMY AND ADENOIDECTOMY      Current Medications: Outpatient Medications Prior to Visit  Medication Sig Dispense Refill  . acetaminophen (TYLENOL) 325 MG tablet Take 2 tablets (650 mg total) by mouth every 4 (four) hours as needed for  headache or mild pain.    Marland Kitchen aspirin 81 MG chewable tablet Chew 1 tablet (81 mg total) by mouth daily.    Marland Kitchen atorvastatin (LIPITOR) 20 MG tablet Take 1 tablet (20 mg total) by mouth daily. 90 tablet 3  . Cholecalciferol (VITAMIN D3) 5000 units CAPS Take 5,000 Units by mouth daily.    . nitroGLYCERIN (NITROSTAT) 0.4 MG SL tablet Place 1 tablet (0.4 mg total) under the tongue every 5 (five) minutes x 3 doses as needed for chest pain. 25 tablet 4  . Probiotic Product (PROBIOTIC PO) Take 1 tablet by mouth daily.    . ticagrelor (BRILINTA) 90 MG TABS tablet Take 1 tablet (90 mg total) by mouth 2 (two) times daily. 180 tablet 3  . metoprolol succinate (TOPROL-XL) 25 MG 24 hr tablet Take 1 tablet (25 mg total) by mouth daily. 30 tablet 6   No facility-administered medications prior to visit.      Allergies:   Lipitor [atorvastatin]; Lisinopril; and Sulfa antibiotics   Social History   Social History  . Marital status: Married    Spouse name: N/A  . Number of children: 2  . Years of education: N/A   Occupational History  . Social research officer, government    Social History Main Topics  . Smoking status: Never Smoker  . Smokeless tobacco: Never Used  . Alcohol use No  . Drug use: No  . Sexual activity: Not Asked   Other Topics Concern  . None   Social History Narrative  . None     Family History:  The patient's family history  includes Cataracts in her mother; Diabetes Mellitus II in her father; Heart attack (age of onset: 18) in her father and sister; Hypertension in her mother; Macular degeneration in her mother.   ROS:   Please see the history of present illness.    Lower back discomfort and occasionally interscapular discomfort. She has anxiety. Easy bruising. All other systems reviewed and are negative.   PHYSICAL EXAM:   VS:  BP 136/74 (BP Location: Left Arm)   Pulse 78   Ht  (1.626 m)   Wt 151 lb (68.5 kg)   BMI 25.92 kg/m    GEN: Well nourished, well developed, in no acute  distress  HEENT: normal  Neck: no JVD, carotid bruits, or masses Cardiac: RRR; no murmurs, rubs, or gallops,no edema  Respiratory:  clear to auscultation bilaterally, normal work of breathing GI: soft, nontender, nondistended, + BS MS: no deformity or atrophy  Skin: warm and dry, no rash Neuro:  Alert and Oriented x 3, Strength and sensation are intact Psych: euthymic mood, full affect  Wt Readings from Last 3 Encounters:  07/30/17 151 lb (68.5 kg)  06/27/17 150 lb (68 kg)  06/07/17 150 lb 12.8 oz (68.4 kg)      Studies/Labs Reviewed:   EKG:  EKG  Normal sinus rhythm was noted on last EKG and exam confirms similar.  Recent Labs: 05/29/2017: ALT 22; BUN 9; Creatinine, Ser 0.74; Hemoglobin 12.4; Platelets 223; Potassium 3.5; Sodium 138   Lipid Panel    Component Value Date/Time   CHOL 164 05/29/2017 0333   TRIG 127 05/29/2017 0333   HDL 34 (L) 05/29/2017 0333   CHOLHDL 4.8 05/29/2017 0333   VLDL 25 05/29/2017 0333   LDLCALC 105 (H) 05/29/2017 0333    Additional studies/ records that were reviewed today include:  CARDIAC CATH 2018:  Diagnostic Diagram      Post-Intervention Diagram       Diagnostic Diagram      Post-Intervention Diagram       Diagnostic Diagram      Post-Intervention Diagram         ASSESSMENT:    1. CAD in native artery   2. Dyspnea, unspecified type   3. Essential hypertension   4. Other hyperlipidemia   5. S/P angioplasty with stent, LCX 05/28/17 with DES      PLAN:  In order of problems listed above:  1. Stable status post acute coronary syndrome with circumflex stenting with DES in July. Doing well without obvious anginal complaints. Interscapular discomfort is not ischemic in my estimation. No further workup is needed for that. 2. Likely related to Brilinta. Plan to switch to clopidogrel 75 mg per day when she uses her current supply of Brilinta. 3. The blood pressure is mildly elevated on the systolic side. Increase  Toprol succinate to 50 mg daily. 4. LDL should be less than 70. Dr. Clelia Croft has recently done a lipid panel on which the patient states her LDL was 55. We did not receive that data but will attempt to get it. If we are able to give this information her November blood testing will not be needed. 5. We discussed the requirement for dual antiplatelet therapy for at least 12 months.  Clinical follow-up in 6 months. Switch over to Plavix at the end of the current supply of Brilinta. Increase metoprolol succinate 50 mg per day for better blood pressure control.    Medication Adjustments/Labs and Tests Ordered: Current medicines are reviewed at length with  the patient today.  Concerns regarding medicines are outlined above.  Medication changes, Labs and Tests ordered today are listed in the Patient Instructions below. Patient Instructions  Medication Instructions:  1) INCREASE Metoprolol Succinate to  once daily.  Take two of your  daily until you have depleted your supply. 2) When you have about a weeks worth of Brilinta, please contact our office so that we can get your prescription sent in for the new medication.    Labwork: None  Testing/Procedures: None  Follow-Up: Your physician wants you to follow-up in: 6 months with Dr. Katrinka Blazing.  You will receive a reminder letter in the mail two months in advance. If you don't receive a letter, please call our office to schedule the follow-up appointment.   Any Other Special Instructions Will Be Listed Below (If Applicable).     If you need a refill on your cardiac medications before your next appointment, please call your pharmacy.      Signed, Lesleigh Noe, MD  07/30/2017 9:45 AM    [facility] [address] [place], [place], [place]  [address] Phone: [phone]; Fax: [phone]

## 2017-07-30 NOTE — Patient Instructions (Signed)
Medication Instructions:  1) INCREASE Metoprolol Succinate to  once daily.  Take two of your  daily until you have depleted your supply. 2) When you have about a weeks worth of Brilinta, please contact our office so that we can get your prescription sent in for the new medication.    Labwork: None  Testing/Procedures: None  Follow-Up: Your physician wants you to follow-up in: 6 months with Dr. Katrinka Blazing.  You will receive a reminder letter in the mail two months in advance. If you don't receive a letter, please call our office to schedule the follow-up appointment.   Any Other Special Instructions Will Be Listed Below (If Applicable).     If you need a refill on your cardiac medications before your next appointment, please call your pharmacy.

## 2017-08-14 ENCOUNTER — Telehealth: Payer: Self-pay | Admitting: Interventional Cardiology

## 2017-08-14 NOTE — Telephone Encounter (Signed)
New message    Pt is calling asking for a call back. She said she has an appt at 3 and wont be able to answer until after. Pt wants to talk about her heart medication and changing them.  Patient c/o Palpitations:  High priority if patient c/o lightheadedness, shortness of breath, or chest pain  1) How long have you had palpitations/irregular HR/ Afib? Are you having the symptoms now? She said heart feels like it's pausing or skipping a beat for last 3 days.  2) Are you currently experiencing lightheadedness, SOB or CP? no  3) Do you have a history of afib (atrial fibrillation) or irregular heart rhythm? no  4) Have you checked your BP or HR? (document readings if available): p-84   5) Are you experiencing any other symptoms? No

## 2017-08-14 NOTE — Telephone Encounter (Signed)
Left message to call back  

## 2017-08-15 ENCOUNTER — Encounter (HOSPITAL_COMMUNITY): Payer: Self-pay | Admitting: *Deleted

## 2017-08-15 ENCOUNTER — Emergency Department (HOSPITAL_COMMUNITY): Payer: BLUE CROSS/BLUE SHIELD

## 2017-08-15 ENCOUNTER — Other Ambulatory Visit: Payer: Self-pay

## 2017-08-15 ENCOUNTER — Emergency Department (HOSPITAL_COMMUNITY)
Admission: EM | Admit: 2017-08-15 | Discharge: 2017-08-15 | Disposition: A | Discharge status: Home / Self Care | Payer: BLUE CROSS/BLUE SHIELD | Attending: Emergency Medicine | Admitting: Emergency Medicine

## 2017-08-15 DIAGNOSIS — Z79899 Other long term (current) drug therapy: Secondary | ICD-10-CM | POA: Diagnosis not present

## 2017-08-15 DIAGNOSIS — I252 Old myocardial infarction: Secondary | ICD-10-CM | POA: Diagnosis not present

## 2017-08-15 DIAGNOSIS — E876 Hypokalemia: Secondary | ICD-10-CM | POA: Diagnosis not present

## 2017-08-15 DIAGNOSIS — I10 Essential (primary) hypertension: Secondary | ICD-10-CM | POA: Insufficient documentation

## 2017-08-15 DIAGNOSIS — I493 Ventricular premature depolarization: Secondary | ICD-10-CM

## 2017-08-15 DIAGNOSIS — R002 Palpitations: Secondary | ICD-10-CM | POA: Diagnosis present

## 2017-08-15 DIAGNOSIS — Z7982 Long term (current) use of aspirin: Secondary | ICD-10-CM | POA: Diagnosis not present

## 2017-08-15 LAB — BASIC METABOLIC PANEL
Anion gap: 10 (ref 5–15)
BUN: 12 mg/dL (ref 6–20)
CALCIUM: 9.3 mg/dL (ref 8.9–10.3)
CO2: 24 mmol/L (ref 22–32)
CREATININE: 0.77 mg/dL (ref 0.44–1.00)
Chloride: 105 mmol/L (ref 101–111)
GFR calc Af Amer: >60 mL/min (ref >60)
GFR calc non Af Amer: >60 mL/min (ref >60)
Glucose, Bld: 118 mg/dL — ABNORMAL HIGH (ref 65–99)
Potassium: 3.1 mmol/L — ABNORMAL LOW (ref 3.5–5.1)
Sodium: 139 mmol/L (ref 135–145)

## 2017-08-15 LAB — CBC
HCT: 40.8 % (ref 36.0–46.0)
Hemoglobin: 13.6 g/dL (ref 12.0–15.0)
MCH: 31.6 pg (ref 26.0–34.0)
MCHC: 33.3 g/dL (ref 30.0–36.0)
MCV: 94.9 fL (ref 78.0–100.0)
Platelets: 289 10*3/uL (ref 150–400)
RBC: 4.3 MIL/uL (ref 3.87–5.11)
RDW: 13.5 % (ref 11.5–15.5)
WBC: 9.5 10*3/uL (ref 4.0–10.5)

## 2017-08-15 LAB — I-STAT TROPONIN, ED: TROPONIN I, POC: 0 ng/mL (ref 0.00–0.08)

## 2017-08-15 MED ORDER — POTASSIUM CHLORIDE CRYS ER 20 MEQ PO TBCR: EXTENDED_RELEASE_TABLET | ORAL | 0 refills | Status: DC

## 2017-08-15 MED ORDER — POTASSIUM CHLORIDE CRYS ER 20 MEQ PO TBCR
40.0000 meq | EXTENDED_RELEASE_TABLET | Freq: Once | ORAL | Status: AC
  Administered 2017-08-15: 40 meq via ORAL
  Filled 2017-08-15: qty 2

## 2017-08-15 NOTE — ED Triage Notes (Signed)
Pt felt her heart skipping a beat yesterday and subsided. Pt reports waking up tonight with palpitations and tachycardia. Hx of MI with most recent stent placement in July. Pt has taken two nitro tonight without relief; denies pain currently.

## 2017-08-15 NOTE — ED Provider Notes (Signed)
[facility]-EMERGENCY DEPT Provider Note   CSN: [id number] Arrival date & time: 08/15/17  0135     History   Chief Complaint Chief Complaint  Patient presents with  . Chest Pain    HPI Maria Lamb is a 66 y.o. female.  The history is provided by the patient.  She had onset yesterday of feeling like her heart was pausing periodically. There is no associated chest pain. She has been having dyspnea, which has not changed recently. Dyspnea has been blamed on one of her medications - ticagrelor. There was no associated chest pain, heaviness, tightness, pressure. Tonight, she woke up at 1 AM with a sense of her heart beating out of her chest. According to her Fit-Bit, her heart rate was 134. There was no associated chest pain, heaviness, tightness, pressure. There was no change in her ongoing dyspnea. There is no nausea, vomiting, diaphoresis. Heart racing lasted about 2 minutes before resolving. She did take a dose of nitroglycerin which did not seem to give any relief. Of note, she is status post coronary stent placement 3 months ago.  Past Medical History:  Diagnosis Date  . Anxiety   . HLD (hyperlipidemia) 05/29/2017  . HSV infection   . Hypertension    Significant white coat syndrome  . NSTEMI (non-ST elevated myocardial infarction) (HCC)   . NSVT (nonsustained ventricular tachycardia) (HCC) 05/29/2017  . Psoriasis   . S/P angioplasty with stent, LCX 05/28/17 with DES 05/29/2017    Patient Active Problem List   Diagnosis Date Noted  . S/P angioplasty with stent, LCX 05/28/17 with DES 05/29/2017  . HLD (hyperlipidemia) 05/29/2017  . NSVT (nonsustained ventricular tachycardia) (HCC) 05/29/2017  . Old non-ST elevation MI July 2018     Past Surgical History:  Procedure Laterality Date  . CORONARY STENT INTERVENTION N/A 05/28/2017   Procedure: Coronary Stent Intervention;  Surgeon: Swaziland, Peter M, MD;  Location: [facility] INVASIVE CV LAB;  Service: Cardiovascular;  Laterality: N/A;  . LEFT  HEART CATH AND CORONARY ANGIOGRAPHY N/A 05/28/2017   Procedure: Left Heart Cath and Coronary Angiography;  Surgeon: Swaziland, Peter M, MD;  Location: [facility] INVASIVE CV LAB;  Service: Cardiovascular;  Laterality: N/A;  . TONSILLECTOMY AND ADENOIDECTOMY      OB History    No data available       Home Medications    Prior to Admission medications   Medication Sig Start Date End Date Taking? Authorizing Provider  acetaminophen (TYLENOL) 325 MG tablet Take 2 tablets (650 mg total) by mouth every 4 (four) hours as needed for headache or mild pain. 05/29/17   Leone Brand, NP  aspirin 81 MG chewable tablet Chew 1 tablet (81 mg total) by mouth daily. 05/30/17   Leone Brand, NP  atorvastatin (LIPITOR) 20 MG tablet Take 1 tablet (20 mg total) by mouth daily. 06/27/17   Lyn Records, MD  Cholecalciferol (VITAMIN D3) 5000 units CAPS Take 5,000 Units by mouth daily.    [provider]  metoprolol succinate (TOPROL-XL) 50 MG 24 hr tablet Take 1 tablet (50 mg total) by mouth daily. Take with or immediately following a meal. 07/30/17 10/28/17  Lyn Records, MD  nitroGLYCERIN (NITROSTAT) 0.4 MG SL tablet Place 1 tablet (0.4 mg total) under the tongue every 5 (five) minutes x 3 doses as needed for chest pain. 05/29/17   Leone Brand, NP  Probiotic Product (PROBIOTIC PO) Take 1 tablet by mouth daily.    [provider]  ticagrelor (  BRILINTA) 90 MG TABS tablet Take 1 tablet (90 mg total) by mouth 2 (two) times daily. 07/22/17   Lyn Records, MD    Family History Family History  Problem Relation Age of Onset  . Hypertension Mother   . Macular degeneration Mother   . Cataracts Mother   . Heart attack Father 43       died after 3rd heart attack  . Diabetes Mellitus II Father   . Heart attack Sister 68       two MI's and has two stents    Social History Social History  Substance Use Topics  . Smoking status: Never Smoker  . Smokeless tobacco: Never Used  . Alcohol use No      Allergies   Lipitor [atorvastatin]; Lisinopril; and Sulfa antibiotics   Review of Systems Review of Systems  All other systems reviewed and are negative.    Physical Exam Updated Vital Signs BP (!) 152/92 (BP Location: Right Arm)   Pulse (!) 111   Temp 98.2 F (36.8 C) (Oral)   Resp 18   SpO2 100%   Physical Exam  Nursing note and vitals reviewed.  66 year old female, resting comfortably and in no acute distress. Vital signs are significant for hypertension and tachycardia. Oxygen saturation is 100%, which is normal. Head is normocephalic and atraumatic. PERRLA, EOMI. Oropharynx is clear. Neck is nontender and supple without adenopathy or JVD. Back is nontender and there is no CVA tenderness. Lungs are clear without rales, wheezes, or rhonchi. Chest is nontender. Heart has regular rate and rhythm without murmur. Abdomen is soft, flat, nontender without masses or hepatosplenomegaly and peristalsis is normoactive. Extremities have no cyanosis or edema, full range of motion is present. Skin is warm and dry without rash. Neurologic: Mental status is normal, cranial nerves are intact, there are no motor or sensory deficits.  ED Treatments / Results  Labs (all labs ordered are listed, but only abnormal results are displayed) Labs Reviewed  BASIC METABOLIC PANEL - Abnormal; Notable for the following:       Result Value   Potassium 3.1 (*)    Glucose, Bld 118 (*)    All other components within normal limits  CBC  I-STAT TROPONIN, ED    EKG  EKG Interpretation  Date/Time:  Thursday August 15 2017 01:34:18 EDT Ventricular Rate:  114 PR Interval:  174 QRS Duration: 80 QT Interval:  344 QTC Calculation: 474 R Axis:   48 Text Interpretation:  Sinus tachycardia Possible Left atrial enlargement Nonspecific ST and T wave abnormality Abnormal ECG When compared with ECG of 05/29/2017, No significant change was found Confirmed by Dione Booze ([id number]) on 08/15/2017 2:12:32  AM       Radiology Dg Chest 2 View  Result Date: 08/15/2017 CLINICAL DATA:  Heart racing and chest pain tonight. EXAM: CHEST  2 VIEW COMPARISON:  05/28/2017 FINDINGS: The heart size and mediastinal contours are within normal limits. Both lungs are clear. The visualized skeletal structures are unremarkable. IMPRESSION: No active cardiopulmonary disease. Electronically Signed   By: Burman Nieves M.D.   On: 08/15/2017 02:29    Procedures Procedures (including critical care time)  Medications Ordered in ED Medications  potassium chloride SA (K-DUR,KLOR-CON) CR tablet 40 mEq (not administered)     Initial Impression / Assessment and Plan / ED Course  I have reviewed the triage vital signs and the nursing notes.  Pertinent labs & imaging results that were available during my care of the  patient were reviewed by me and considered in my medical decision making (see chart for details).  2 different types of palpitations. The pauses that she has felt are most likely either PACs or PVCs. While I am talking to her, I have noted PVCs on her cardiac monitor. She confirms that she feels the pauses at the time that I see the PVCs on her cardiac monitor. The rapid heartbeat that she woke up with is a different type of arrhythmia. This could be paroxysmal atrial fibrillation, atrial flutter, paroxysmal supraventricular tachycardia, or sinus tachycardia. Doubt ventricular tachycardia. ED workup is significant for potassium of 3.1 which is increasing her risk for PVCs. She's given potassium in the ED and is sent home with prescription for potassium. Recommended she follow-up with her cardiologist about possible outpatient cardiac monitoring to try to identify what her tachyarrhythmia was.  Final Clinical Impressions(s) / ED Diagnoses   Final diagnoses:  Palpitations  PVC's (premature ventricular contractions)  Hypokalemia    New Prescriptions New Prescriptions   POTASSIUM CHLORIDE SA  (K-DUR,KLOR-CON) 20 MEQ TABLET    Take two pills twice a day for three days, then one pill twice a day     Dione Booze, MD 08/15/17 [phone]

## 2017-08-15 NOTE — Discharge Instructions (Signed)
Talk with Dr. Katrinka Blazing about whether you should wear a heart monitor to try to identify what rhythm your heart is in when it is beating out of your chest.

## 2017-08-16 NOTE — Telephone Encounter (Signed)
Spoke with pt and made her aware that I seen where she had went to ER and they wanted her to f/u with cardiology.  Pt states that she is feeling much better and feels this was all likely d/t hypokalemia.  Pt states she will call back for an appt if she feels like she needs it as she has a lot going on right now until about mid month.  Pt appreciative for call.

## 2017-08-16 NOTE — Progress Notes (Signed)
Please schedule 30 day monitor

## 2017-08-20 ENCOUNTER — Telehealth: Payer: Self-pay | Admitting: *Deleted

## 2017-08-20 DIAGNOSIS — I472 Ventricular tachycardia: Secondary | ICD-10-CM

## 2017-08-20 DIAGNOSIS — I4729 Other ventricular tachycardia: Secondary | ICD-10-CM

## 2017-08-20 DIAGNOSIS — R002 Palpitations: Secondary | ICD-10-CM

## 2017-08-20 NOTE — Telephone Encounter (Signed)
Lyn Records, MD  Cardiology     Please schedule 30 day monitor     Spoke with pt and went over recommendations per Dr. Katrinka Blazing.  Pt states that she needs to know how much this is going to cost prior to agreeing to wear the monitor.  Advised I would send message to our billing dept to see if they can help.  Pt appreciative for call.

## 2017-08-22 NOTE — Telephone Encounter (Signed)
Called patient back with benefits quote from [employer] for event monitor.  She will call Victorino Dike back to schedule.

## 2017-09-04 NOTE — Telephone Encounter (Signed)
Spoke with pt to follow up and she said she is agreeable to wear the monitor.  Advised order has been placed and I will have schedulers contact her with an appt.  Pt appreciative for call.

## 2017-09-23 ENCOUNTER — Ambulatory Visit (INDEPENDENT_AMBULATORY_CARE_PROVIDER_SITE_OTHER): Payer: BLUE CROSS/BLUE SHIELD

## 2017-09-23 ENCOUNTER — Other Ambulatory Visit: Payer: BLUE CROSS/BLUE SHIELD | Admitting: *Deleted

## 2017-09-23 DIAGNOSIS — R002 Palpitations: Secondary | ICD-10-CM | POA: Diagnosis not present

## 2017-09-23 DIAGNOSIS — E7849 Other hyperlipidemia: Secondary | ICD-10-CM

## 2017-09-23 DIAGNOSIS — I472 Ventricular tachycardia: Secondary | ICD-10-CM

## 2017-09-23 DIAGNOSIS — I4729 Other ventricular tachycardia: Secondary | ICD-10-CM

## 2017-09-23 LAB — HEPATIC FUNCTION PANEL
ALBUMIN: 4.1 g/dL (ref 3.6–4.8)
ALK PHOS: 87 IU/L (ref 39–117)
ALT: 16 IU/L (ref 0–32)
AST: 16 IU/L (ref 0–40)
Bilirubin Total: 0.4 mg/dL (ref 0.0–1.2)
Bilirubin, Direct: 0.13 mg/dL (ref 0.00–0.40)
TOTAL PROTEIN: 6.6 g/dL (ref 6.0–8.5)

## 2017-09-23 LAB — LIPID PANEL
Chol/HDL Ratio: 2.5 ratio (ref 0.0–4.4)
Cholesterol, Total: 111 mg/dL (ref 100–199)
HDL: 44 mg/dL (ref >39)
LDL CALC: 51 mg/dL (ref 0–99)
TRIGLYCERIDES: 78 mg/dL (ref 0–149)
VLDL Cholesterol Cal: 16 mg/dL (ref 5–40)

## 2017-10-28 ENCOUNTER — Telehealth: Payer: Self-pay | Admitting: Interventional Cardiology

## 2017-10-28 ENCOUNTER — Ambulatory Visit: Payer: BLUE CROSS/BLUE SHIELD | Admitting: Interventional Cardiology

## 2017-10-28 NOTE — Telephone Encounter (Signed)
Spoke with pt and informed her of monitor results.  Pt verbalized understanding.  Pt also mentioned that she feels like one of her medications may be the cause of some gastric pain she has had.  Pt wanted to know if ok to hold her Atorvastatin for a week or two to see if it may be the cause of her problems.  Advised this would be fine and to call back after two weeks and let us know so we can change medication if gastric pain resolves.  Pt verbalized understanding and was in agreement with this plan.

## 2017-10-28 NOTE — Telephone Encounter (Signed)
New message    Pt is asking for a call back about her monitor results. She also said she had some questions about her medication.

## 2018-03-06 DIAGNOSIS — M62830 Muscle spasm of back: Secondary | ICD-10-CM | POA: Diagnosis not present

## 2018-03-06 DIAGNOSIS — M542 Cervicalgia: Secondary | ICD-10-CM | POA: Diagnosis not present

## 2018-03-06 DIAGNOSIS — M9901 Segmental and somatic dysfunction of cervical region: Secondary | ICD-10-CM | POA: Diagnosis not present

## 2018-03-06 DIAGNOSIS — M9902 Segmental and somatic dysfunction of thoracic region: Secondary | ICD-10-CM | POA: Diagnosis not present

## 2018-03-17 DIAGNOSIS — M7731 Calcaneal spur, right foot: Secondary | ICD-10-CM | POA: Diagnosis not present

## 2018-03-17 DIAGNOSIS — M71571 Other bursitis, not elsewhere classified, right ankle and foot: Secondary | ICD-10-CM | POA: Diagnosis not present

## 2018-03-17 DIAGNOSIS — M722 Plantar fascial fibromatosis: Secondary | ICD-10-CM | POA: Diagnosis not present

## 2018-03-18 DIAGNOSIS — R232 Flushing: Secondary | ICD-10-CM | POA: Diagnosis not present

## 2018-03-18 DIAGNOSIS — Z0000 Encounter for general adult medical examination without abnormal findings: Secondary | ICD-10-CM | POA: Diagnosis not present

## 2018-03-18 DIAGNOSIS — I119 Hypertensive heart disease without heart failure: Secondary | ICD-10-CM | POA: Diagnosis not present

## 2018-03-18 DIAGNOSIS — E559 Vitamin D deficiency, unspecified: Secondary | ICD-10-CM | POA: Diagnosis not present

## 2018-03-18 DIAGNOSIS — R5383 Other fatigue: Secondary | ICD-10-CM | POA: Diagnosis not present

## 2018-03-18 DIAGNOSIS — E782 Mixed hyperlipidemia: Secondary | ICD-10-CM | POA: Diagnosis not present

## 2018-03-28 DIAGNOSIS — H2513 Age-related nuclear cataract, bilateral: Secondary | ICD-10-CM | POA: Diagnosis not present

## 2018-03-28 DIAGNOSIS — H43392 Other vitreous opacities, left eye: Secondary | ICD-10-CM | POA: Diagnosis not present

## 2018-03-28 DIAGNOSIS — H43813 Vitreous degeneration, bilateral: Secondary | ICD-10-CM | POA: Diagnosis not present

## 2018-03-31 DIAGNOSIS — M722 Plantar fascial fibromatosis: Secondary | ICD-10-CM | POA: Diagnosis not present

## 2018-03-31 DIAGNOSIS — M71571 Other bursitis, not elsewhere classified, right ankle and foot: Secondary | ICD-10-CM | POA: Diagnosis not present

## 2018-04-10 DIAGNOSIS — M62830 Muscle spasm of back: Secondary | ICD-10-CM | POA: Diagnosis not present

## 2018-04-10 DIAGNOSIS — M9901 Segmental and somatic dysfunction of cervical region: Secondary | ICD-10-CM | POA: Diagnosis not present

## 2018-04-10 DIAGNOSIS — M9902 Segmental and somatic dysfunction of thoracic region: Secondary | ICD-10-CM | POA: Diagnosis not present

## 2018-04-10 DIAGNOSIS — M542 Cervicalgia: Secondary | ICD-10-CM | POA: Diagnosis not present

## 2018-04-11 DIAGNOSIS — M9902 Segmental and somatic dysfunction of thoracic region: Secondary | ICD-10-CM | POA: Diagnosis not present

## 2018-04-11 DIAGNOSIS — M62830 Muscle spasm of back: Secondary | ICD-10-CM | POA: Diagnosis not present

## 2018-04-11 DIAGNOSIS — M9901 Segmental and somatic dysfunction of cervical region: Secondary | ICD-10-CM | POA: Diagnosis not present

## 2018-04-11 DIAGNOSIS — M542 Cervicalgia: Secondary | ICD-10-CM | POA: Diagnosis not present

## 2018-05-08 NOTE — Progress Notes (Signed)
Cardiology Office Note    Date:  05/09/2018   ID:  Maria Lamb, DOB [birth year], MRN [id number]  PCP:  Lupita Raider, MD  Cardiologist: Lesleigh Noe, MD   Chief Complaint  Patient presents with  . Coronary Artery Disease    History of Present Illness:  Maria Lamb is a 67 y.o. female with history of acute coronary syndrome requiring circumflex stent, hyperlipidemia, and hypertension.  She is doing well.  She denies angina.  She has been relatively sedentary.  No nitroglycerin use.  No medication side effects.  She has not followed a heart healthy diet.  She lobbied to have all of her medications decreased in intensity when she was vigorously following diet and exercise greater than a year ago.   Past Medical History:  Diagnosis Date  . Anxiety   . HLD (hyperlipidemia) 05/29/2017  . HSV infection   . Hypertension    Significant white coat syndrome  . NSTEMI (non-ST elevated myocardial infarction) (HCC)   . NSVT (nonsustained ventricular tachycardia) (HCC) 05/29/2017  . Old non-ST elevation MI July 2018   . Psoriasis   . S/P angioplasty with stent, LCX 05/28/17 with DES 05/29/2017    Past Surgical History:  Procedure Laterality Date  . CORONARY STENT INTERVENTION N/A 05/28/2017   Procedure: Coronary Stent Intervention;  Surgeon: Swaziland, Peter M, MD;  Location: [facility] INVASIVE CV LAB;  Service: Cardiovascular;  Laterality: N/A;  . LEFT HEART CATH AND CORONARY ANGIOGRAPHY N/A 05/28/2017   Procedure: Left Heart Cath and Coronary Angiography;  Surgeon: Swaziland, Peter M, MD;  Location: [facility] INVASIVE CV LAB;  Service: Cardiovascular;  Laterality: N/A;  . TONSILLECTOMY AND ADENOIDECTOMY      Current Medications: Outpatient Medications Prior to Visit  Medication Sig Dispense Refill  . acetaminophen (TYLENOL) 325 MG tablet Take 2 tablets (650 mg total) by mouth every 4 (four) hours as needed for headache or mild pain.    Marland Kitchen aspirin 81 MG chewable tablet Chew 1 tablet (81 mg  total) by mouth daily.    . Cholecalciferol (VITAMIN D3) 5000 units CAPS Take 5,000 Units by mouth daily.    . Coenzyme Q10 (COQ10 PO) Take 1 capsule by mouth 4 (four) times a week.    . nitroGLYCERIN (NITROSTAT) 0.4 MG SL tablet Place 1 tablet (0.4 mg total) under the tongue every 5 (five) minutes x 3 doses as needed for chest pain. 25 tablet 4  . Probiotic Product (PROBIOTIC PO) Take 1 tablet by mouth daily.    Marland Kitchen atorvastatin (LIPITOR) 20 MG tablet Take 1 tablet (20 mg total) by mouth daily. 90 tablet 3  . metoprolol succinate (TOPROL-XL) 25 MG 24 hr tablet Take 25 mg by mouth daily.    . ticagrelor (BRILINTA) 90 MG TABS tablet Take 1 tablet (90 mg total) by mouth 2 (two) times daily. 180 tablet 3  . metoprolol succinate (TOPROL-XL) 50 MG 24 hr tablet Take 1 tablet (50 mg total) by mouth daily. Take with or immediately following a meal. 90 tablet 3  . potassium chloride SA (K-DUR,KLOR-CON) 20 MEQ tablet Take two pills twice a day for three days, then one pill twice a day (Patient not taking: Reported on 05/09/2018) 20 tablet 0   No facility-administered medications prior to visit.      Allergies:   Lipitor [atorvastatin]; Lisinopril; and Sulfa antibiotics   Social History   Socioeconomic History  . Marital status: Married    Spouse name: Not on file  .  Number of children: 2  . Years of education: Not on file  . Highest education level: Not on file  Occupational History  . Occupation: Set designernterior Design  Social Needs  . Financial resource strain: Not on file  . Food insecurity:    Worry: Not on file    Inability: Not on file  . Transportation needs:    Medical: Not on file    Non-medical: Not on file  Tobacco Use  . Smoking status: Never Smoker  . Smokeless tobacco: Never Used  Substance and Sexual Activity  . Alcohol use: No  . Drug use: No  . Sexual activity: Not on file  Lifestyle  . Physical activity:    Days per week: Not on file    Minutes per session: Not on file  .  Stress: Not on file  Relationships  . Social connections:    Talks on phone: Not on file    Gets together: Not on file    Attends religious service: Not on file    Active member of club or organization: Not on file    Attends meetings of clubs or organizations: Not on file    Relationship status: Not on file  Other Topics Concern  . Not on file  Social History Narrative  . Not on file     Family History:  The patient's family history includes Cataracts in her mother; Diabetes Mellitus II in her father; Heart attack (age of onset: 3464) in her father and sister; Hypertension in her mother; Macular degeneration in her mother.   ROS:   Please see the history of present illness.    Vision disturbance, some dyspnea on exertion, back pain, easy bruising, leg and calf discomfort All other systems reviewed and are negative.   PHYSICAL EXAM:   VS:  BP (!) 162/94   Pulse 76   Ht 5\' 4"  (1.626 m)   Wt 155 lb 9.6 oz (70.6 kg)   BMI 26.71 kg/m    GEN: Obese.  Well developed, in no acute distress  HEENT: normal  Neck: no JVD, carotid bruits, or masses Cardiac: RRR; no murmurs, rubs, or gallops,no edema  Respiratory:  clear to auscultation bilaterally, normal work of breathing GI: soft, nontender, nondistended, + BS MS: no deformity or atrophy  Skin: warm and dry, no rash Neuro:  Alert and Oriented x 3, Strength and sensation are intact Psych: euthymic mood, full affect  Wt Readings from Last 3 Encounters:  05/09/18 155 lb 9.6 oz (70.6 kg)  07/30/17 151 lb (68.5 kg)  06/27/17 150 lb (68 kg)      Studies/Labs Reviewed:   EKG:  EKG  Not repeated  Recent Labs: 08/15/2017: BUN 12; Creatinine, Ser 0.77; Hemoglobin 13.6; Platelets 289; Potassium 3.1; Sodium 139 09/23/2017: ALT 16   Lipid Panel    Component Value Date/Time   CHOL 111 09/23/2017 0816   TRIG 78 09/23/2017 0816   HDL 44 09/23/2017 0816   CHOLHDL 2.5 09/23/2017 0816   CHOLHDL 4.8 05/29/2017 0333   VLDL 25 05/29/2017  0333   LDLCALC 51 09/23/2017 0816    Additional studies/ records that were reviewed today include:  Cardiac catheterization July 2018  Coronary Diagrams   Diagnostic Diagram       Post-Intervention Diagram          ASSESSMENT:    1. S/P angioplasty with stent, LCX 05/28/17 with DES   2. Other hyperlipidemia   3. Essential hypertension      PLAN:  In order of problems listed above:  1. Risk factors are out of control.  Most recent LDL was 130.  Blood pressure today is elevated.  Increase intensity of statin therapy to 40 mg of atorvastatin or 20 mg of rosuvastatin, reinstitute strict diet, and increase metoprolol to 50 mg/day.  70-month follow-up.  Switch to Plavix 75 mg/day.  Discontinue Brilinta. 2. Increase statin intensity as noted above.  Repeat lipid panel with Dr. Cam Hai next month. 3. Increase metoprolol to 50 mg/day.  May need to add ACE inhibitor if blood pressure does not easily controlled to 130/80 mmHg.  Low-salt diet.  Exercise and weight loss also discussed.   Encouraged at least 150 minutes of moderate aerobic activity per week.  LDL target should be less than 70.  Blood pressure 130/80 mmHg.  Increase fiber and plant-based components in diet and decrease carbohydrate intake.    Medication Adjustments/Labs and Tests Ordered: Current medicines are reviewed at length with the patient today.  Concerns regarding medicines are outlined above.  Medication changes, Labs and Tests ordered today are listed in the Patient Instructions below. Patient Instructions  Medication Instructions:  1) DISCONTINUE Brilinta 2) START Plavix 75mg  once daily 3) INCREASE Toprol XL 50 once daily 4) INCREASE Atorvastatin to 40mg  once daily  Labwork: None  Testing/Procedures: None  Follow-Up: Your physician recommends that you schedule a follow-up appointment in: 6 months with Dr. Katrinka Blazing.    Any Other Special Instructions Will Be Listed Below (If  Applicable).     If you need a refill on your cardiac medications before your next appointment, please call your pharmacy.      Signed, Lesleigh Noe, MD  05/09/2018 1:07 PM    [facility] [address] [place], [place], [place]  [address] Phone: [phone]; Fax: [phone]

## 2018-05-09 ENCOUNTER — Encounter: Payer: Self-pay | Admitting: Interventional Cardiology

## 2018-05-09 ENCOUNTER — Encounter

## 2018-05-09 ENCOUNTER — Ambulatory Visit: Payer: PPO | Admitting: Interventional Cardiology

## 2018-05-09 VITALS — BP 162/94 | HR 76 | Ht 64.0 in | Wt 155.6 lb

## 2018-05-09 DIAGNOSIS — Z95820 Peripheral vascular angioplasty status with implants and grafts: Secondary | ICD-10-CM | POA: Diagnosis not present

## 2018-05-09 DIAGNOSIS — E7849 Other hyperlipidemia: Secondary | ICD-10-CM | POA: Diagnosis not present

## 2018-05-09 DIAGNOSIS — I10 Essential (primary) hypertension: Secondary | ICD-10-CM | POA: Insufficient documentation

## 2018-05-09 MED ORDER — METOPROLOL SUCCINATE ER 50 MG PO TB24: 50.0000 mg | ORAL_TABLET | Freq: Every day | ORAL | 3 refills | Status: DC

## 2018-05-09 MED ORDER — CLOPIDOGREL BISULFATE 75 MG PO TABS: 75.0000 mg | ORAL_TABLET | Freq: Every day | ORAL | 3 refills | Status: DC

## 2018-05-09 MED ORDER — ATORVASTATIN CALCIUM 40 MG PO TABS: 40.0000 mg | ORAL_TABLET | Freq: Every day | ORAL | 3 refills | Status: DC

## 2018-05-09 NOTE — Patient Instructions (Signed)
Medication Instructions:  1) DISCONTINUE Brilinta 2) START Plavix 75mg  once daily 3) INCREASE Toprol XL 50 once daily 4) INCREASE Atorvastatin to 40mg  once daily  Labwork: None  Testing/Procedures: None  Follow-Up: Your physician recommends that you schedule a follow-up appointment in: 6 months with Dr. Katrinka BlazingSmith.    Any Other Special Instructions Will Be Listed Below (If Applicable).     If you need a refill on your cardiac medications before your next appointment, please call your pharmacy.

## 2018-06-03 DIAGNOSIS — H2512 Age-related nuclear cataract, left eye: Secondary | ICD-10-CM | POA: Diagnosis not present

## 2018-06-03 DIAGNOSIS — H25043 Posterior subcapsular polar age-related cataract, bilateral: Secondary | ICD-10-CM | POA: Diagnosis not present

## 2018-06-03 DIAGNOSIS — H2513 Age-related nuclear cataract, bilateral: Secondary | ICD-10-CM | POA: Diagnosis not present

## 2018-06-03 DIAGNOSIS — H18413 Arcus senilis, bilateral: Secondary | ICD-10-CM | POA: Diagnosis not present

## 2018-06-03 DIAGNOSIS — H25013 Cortical age-related cataract, bilateral: Secondary | ICD-10-CM | POA: Diagnosis not present

## 2018-07-09 DIAGNOSIS — I2510 Atherosclerotic heart disease of native coronary artery without angina pectoris: Secondary | ICD-10-CM | POA: Diagnosis not present

## 2018-07-09 DIAGNOSIS — E782 Mixed hyperlipidemia: Secondary | ICD-10-CM | POA: Diagnosis not present

## 2018-07-09 DIAGNOSIS — R6889 Other general symptoms and signs: Secondary | ICD-10-CM | POA: Diagnosis not present

## 2018-07-09 DIAGNOSIS — R7303 Prediabetes: Secondary | ICD-10-CM | POA: Diagnosis not present

## 2018-07-09 DIAGNOSIS — I119 Hypertensive heart disease without heart failure: Secondary | ICD-10-CM | POA: Diagnosis not present

## 2018-07-09 DIAGNOSIS — F411 Generalized anxiety disorder: Secondary | ICD-10-CM | POA: Diagnosis not present

## 2018-08-06 ENCOUNTER — Telehealth: Payer: Self-pay | Admitting: Interventional Cardiology

## 2018-08-06 NOTE — Telephone Encounter (Signed)
New Message        Patient is having cataract surgery on 08/11/2018 and would like to know if she is to continue " Plavix and Asprin"? Pls call and advise.

## 2018-08-07 NOTE — Telephone Encounter (Signed)
Spoke with pt and she states that she is having cataract surgery on Monday.  Eye surgeon told her to hold Plavix and ASA only the morning of her procedure.  She wanted to make sure that was ok.  Advised she is over a year out from her stent and if they feel that is sufficient from a bleeding standpoint that should be fine.  Pt verbalized understanding and was appreciative for call.

## 2018-08-11 DIAGNOSIS — H2512 Age-related nuclear cataract, left eye: Secondary | ICD-10-CM | POA: Diagnosis not present

## 2018-08-12 DIAGNOSIS — H2511 Age-related nuclear cataract, right eye: Secondary | ICD-10-CM | POA: Diagnosis not present

## 2018-08-12 DIAGNOSIS — H25041 Posterior subcapsular polar age-related cataract, right eye: Secondary | ICD-10-CM | POA: Diagnosis not present

## 2018-08-12 DIAGNOSIS — H25011 Cortical age-related cataract, right eye: Secondary | ICD-10-CM | POA: Diagnosis not present

## 2018-08-14 ENCOUNTER — Encounter: Payer: Self-pay | Admitting: Interventional Cardiology

## 2018-08-20 ENCOUNTER — Other Ambulatory Visit: Payer: Self-pay | Admitting: Cardiology

## 2018-09-01 DIAGNOSIS — H2511 Age-related nuclear cataract, right eye: Secondary | ICD-10-CM | POA: Diagnosis not present

## 2018-10-02 DIAGNOSIS — M9901 Segmental and somatic dysfunction of cervical region: Secondary | ICD-10-CM | POA: Diagnosis not present

## 2018-10-02 DIAGNOSIS — M9902 Segmental and somatic dysfunction of thoracic region: Secondary | ICD-10-CM | POA: Diagnosis not present

## 2018-10-02 DIAGNOSIS — M62830 Muscle spasm of back: Secondary | ICD-10-CM | POA: Diagnosis not present

## 2018-10-02 DIAGNOSIS — M542 Cervicalgia: Secondary | ICD-10-CM | POA: Diagnosis not present

## 2018-10-07 DIAGNOSIS — M542 Cervicalgia: Secondary | ICD-10-CM | POA: Diagnosis not present

## 2018-10-07 DIAGNOSIS — M9901 Segmental and somatic dysfunction of cervical region: Secondary | ICD-10-CM | POA: Diagnosis not present

## 2018-10-07 DIAGNOSIS — M62830 Muscle spasm of back: Secondary | ICD-10-CM | POA: Diagnosis not present

## 2018-10-07 DIAGNOSIS — M9902 Segmental and somatic dysfunction of thoracic region: Secondary | ICD-10-CM | POA: Diagnosis not present

## 2018-10-21 ENCOUNTER — Telehealth: Payer: Self-pay | Admitting: Interventional Cardiology

## 2018-10-21 NOTE — Telephone Encounter (Signed)
Will route to Dr. Katrinka BlazingSmith to see if patient can utilize an infrared sauna.

## 2018-10-21 NOTE — Telephone Encounter (Signed)
New Message   Patient is calling to see if she can participate in infrared sauna.Please call to discuss.

## 2018-10-22 ENCOUNTER — Encounter: Payer: Self-pay | Admitting: Interventional Cardiology

## 2018-10-22 NOTE — Telephone Encounter (Signed)
That does not sound like a good idea.

## 2018-10-23 NOTE — Telephone Encounter (Signed)
Spoke with pt and made her aware of Dr. Michaelle CopasSmith's recommendation.  Pt appreciative for call.

## 2018-10-27 ENCOUNTER — Ambulatory Visit: Payer: PPO | Admitting: Interventional Cardiology

## 2018-11-12 NOTE — Progress Notes (Signed)
Cardiology Office Note:    Date:  11/14/2018   ID:  Maria Lamb, DOB 05/22/1951, MRN [id number]  PCP:  Lupita RaiderShaw, Kimberlee, MD  Cardiologist:  Lesleigh NoeHenry W Iylah Dworkin III, MD   Referring MD: Lupita RaiderShaw, Kimberlee, MD   Chief Complaint  Patient presents with  . Coronary Artery Disease    History of Present Illness:    Maria ArchDenise H Kovacic is a 68 y.o. female with a hx of acute coronary syndrome requiring circumflex stent in 2018, hyperlipidemia, and hypertension.  Overall, Maria Lamb is doing well.  She denies angina.  No medication side effects.  She has not had syncope.  She has gained a small amount of weight.  She is not being careful with her diet.  She has not needed nitroglycerin.  On presentation her anginal/ischemic complaint was interscapular discomfort.  She has had no recurrence.  She is now 18 months post infarction.  Her sister is also a patient of mine and has moderate/complicated CAD as well.  Past Medical History:  Diagnosis Date  . Anxiety   . HLD (hyperlipidemia) 05/29/2017  . HSV infection   . Hypertension    Significant white coat syndrome  . NSTEMI (non-ST elevated myocardial infarction) (HCC)   . NSVT (nonsustained ventricular tachycardia) (HCC) 05/29/2017  . Old non-ST elevation MI July 2018   . Psoriasis   . S/P angioplasty with stent, LCX 05/28/17 with DES 05/29/2017    Past Surgical History:  Procedure Laterality Date  . CORONARY STENT INTERVENTION N/A 05/28/2017   Procedure: Coronary Stent Intervention;  Surgeon: SwazilandJordan, Peter M, MD;  [facility];  Service: Cardiovascular;  Laterality: N/A;  . LEFT HEART CATH AND CORONARY ANGIOGRAPHY N/A 05/28/2017   Procedure: Left Heart Cath and Coronary Angiography;  Surgeon: SwazilandJordan, Peter M, MD;  [facility];  Service: Cardiovascular;  Laterality: N/A;  . TONSILLECTOMY AND ADENOIDECTOMY      Current Medications: Current Meds  Medication Sig  . acetaminophen (TYLENOL) 325 MG tablet Take 2 tablets  (650 mg total) by mouth every 4 (four) hours as needed for headache or mild pain.  Marland Kitchen. aspirin 81 MG chewable tablet Chew 1 tablet (81 mg total) by mouth daily.  Marland Kitchen. atorvastatin (LIPITOR) 40 MG tablet Take 1 tablet (40 mg total) by mouth daily.  . Cholecalciferol (VITAMIN D3) 5000 units CAPS Take 5,000 Units by mouth daily.  . metoprolol succinate (TOPROL-XL) 50 MG 24 hr tablet Take 1 tablet (50 mg total) by mouth daily. Take with or immediately following a meal.  . nitroGLYCERIN (NITROSTAT) 0.4 MG SL tablet ONE TAB UNDER TONGUE-MAY REPEAT FOR 2 DOSES EVERY 5 MIN X3TIMES-IF NORESPONSE CALL 911  . Probiotic Product (PROBIOTIC PO) Take 1 tablet by mouth daily.  . [DISCONTINUED] clopidogrel (PLAVIX) 75 MG tablet Take 1 tablet (75 mg total) by mouth daily.     Allergies:   Lipitor [atorvastatin]; Lisinopril; and Sulfa antibiotics   Social History   Socioeconomic History  . Marital status: Married    Spouse name: Not on file  . Number of children: 2  . Years of education: Not on file  . Highest education level: Not on file  Occupational History  . Occupation: Set designernterior Design  Social Needs  . Financial resource strain: Not on file  . Food insecurity:    Worry: Not on file    Inability: Not on file  . Transportation needs:    Medical: Not on file    Non-medical: Not on file  Tobacco Use  . Smoking status: Never Smoker  . Smokeless tobacco: Never Used  Substance and Sexual Activity  . Alcohol use: No  . Drug use: No  . Sexual activity: Not on file  Lifestyle  . Physical activity:    Days per week: Not on file    Minutes per session: Not on file  . Stress: Not on file  Relationships  . Social connections:    Talks on phone: Not on file    Gets together: Not on file    Attends religious service: Not on file    Active member of club or organization: Not on file    Attends meetings of clubs or organizations: Not on file    Relationship status: Not on file  Other Topics Concern  .  Not on file  Social History Narrative  . Not on file     Family History: The patient's family history includes Cataracts in her mother; Diabetes Mellitus II in her father; Heart attack (age of onset: 9) in her father and sister; Hypertension in her mother; Macular degeneration in her mother.  ROS:   Please see the history of present illness.    She has psoriasis.  Has had some rash.  Wants to have light treatment.  Occasional nausea and vomiting.  All other systems reviewed and are negative.  EKGs/Labs/Other Studies Reviewed:    The following studies were reviewed today: No new data  EKG:  EKG is normal sinus rhythm with normal overall EKG appearance.  Tracing was performed today on November 14, 2018.  Recent Labs: No results found for requested labs within last 8760 hours.  Recent Lipid Panel    Component Value Date/Time   CHOL 111 09/23/2017 0816   TRIG 78 09/23/2017 0816   HDL 44 09/23/2017 0816   CHOLHDL 2.5 09/23/2017 0816   CHOLHDL 4.8 05/29/2017 0333   VLDL 25 05/29/2017 0333   LDLCALC 51 09/23/2017 0816    Physical Exam:    VS:  BP (!) 154/82   Pulse 75   Ht 5\' 4"  (1.626 m)   Wt 160 lb 12.8 oz (72.9 kg)   SpO2 97%   BMI 27.60 kg/m     Wt Readings from Last 3 Encounters:  11/14/18 160 lb 12.8 oz (72.9 kg)  05/09/18 155 lb 9.6 oz (70.6 kg)  07/30/17 151 lb (68.5 kg)     GEN:. No acute distress HEENT: Normal NECK: No JVD. LYMPHATICS: No lymphadenopathy CARDIAC: RRR.  No murmur, gallop, edema VASCULAR: Pulses 2+ radial and carotid, Bruits are absent in the carotids RESPIRATORY:  Clear to auscultation without rales, wheezing or rhonchi  ABDOMEN: Soft, non-tender, non-distended, No pulsatile mass, MUSCULOSKELETAL: No deformity  SKIN: Warm and dry NEUROLOGIC:  Alert and oriented x 3 PSYCHIATRIC:  Normal affect   ASSESSMENT:    1. Essential hypertension   2. Other hyperlipidemia   3. CAD in native artery    PLAN:    In order of problems listed  above:  1. Elevated blood pressure is of concern.  Target is 130/80 mmHg.  Low-salt diet.  Decrease/eliminate nonsteroidal anti-inflammatory therapy.  She states her blood pressures tend to run around 130/80 mmHg at home but always higher in the physician's office.  She will record blood pressures for a week and send [place] the data.  May need to further increase antihypertensive intensity. 2. LDL target less than 70.  When most recently checked in late fall, LDL-C was 50 mg/dL.  Continue current  therapy. 3. Without angina.  Single stent placed in the circumflex territory July 2018.  Discontinue Plavix.  Continue aggressive secondary risk prevention.  Overall education and awareness concerning primary/secondary risk prevention was discussed in detail: LDL less than 70, hemoglobin A1c less than 7, blood pressure target less than 130/80 mmHg, >150 minutes of moderate aerobic activity per week, avoidance of smoking, weight control (via diet and exercise), and continued surveillance/management of/for obstructive sleep apnea.  Clinical follow-up in 1 year   Medication Adjustments/Labs and Tests Ordered: Current medicines are reviewed at length with the patient today.  Concerns regarding medicines are outlined above.  Orders Placed This Encounter  Procedures  . EKG 12-Lead   No orders of the defined types were placed in this encounter.   Patient Instructions  Medication Instructions:  1) DISCONTINUE Plavix  If you need a refill on your cardiac medications before your next appointment, please call your pharmacy.   Lab work: None If you have labs (blood work) drawn today and your tests are completely normal, you will receive your results only by: Marland Kitchen. MyChart Message (if you have MyChart) OR . A paper copy in the mail If you have any lab test that is abnormal or we need to change your treatment, we will call you to review the results.  Testing/Procedures: None  Follow-Up: At [facility], you  and your health needs are our priority.  As part of our continuing mission to provide you with exceptional heart care, we have created designated Provider Care Teams.  These Care Teams include your primary Cardiologist (physician) and Advanced Practice Providers (APPs -  Physician Assistants and Nurse Practitioners) who all work together to provide you with the care you need, when you need it. You will need a follow up appointment in 12 months.  Please call our office 2 months in advance to schedule this appointment.  You may see Lesleigh NoeHenry W Rheta Hemmelgarn III, MD or one of the following Advanced Practice Providers on your designated Care Team:   Norma FredricksonLori Gerhardt, NP Nada BoozerLaura Ingold, NP . Georgie ChardJill McDaniel, NP  Any Other Special Instructions Will Be Listed Below (If Applicable).  Please monitor your blood pressure for 7 days and call us with those readings or send through MyChart.      Signed, Lesleigh NoeHenry W Kordel Leavy III, MD  11/14/2018 11:40 AM    [facility]

## 2018-11-14 ENCOUNTER — Encounter: Payer: Self-pay | Admitting: Interventional Cardiology

## 2018-11-14 ENCOUNTER — Ambulatory Visit: Payer: PPO | Admitting: Interventional Cardiology

## 2018-11-14 VITALS — BP 154/82 | HR 75 | Ht 64.0 in | Wt 160.8 lb

## 2018-11-14 DIAGNOSIS — E7849 Other hyperlipidemia: Secondary | ICD-10-CM

## 2018-11-14 DIAGNOSIS — I10 Essential (primary) hypertension: Secondary | ICD-10-CM | POA: Diagnosis not present

## 2018-11-14 DIAGNOSIS — I2510 Atherosclerotic heart disease of native coronary artery without angina pectoris: Secondary | ICD-10-CM

## 2018-11-14 NOTE — Patient Instructions (Addendum)
Medication Instructions:  1) DISCONTINUE Plavix  If you need a refill on your cardiac medications before your next appointment, please call your pharmacy.   Lab work: None If you have labs (blood work) drawn today and your tests are completely normal, you will receive your results only by: Marland Kitchen MyChart Message (if you have MyChart) OR . A paper copy in the mail If you have any lab test that is abnormal or we need to change your treatment, we will call you to review the results.  Testing/Procedures: None  Follow-Up: At [facility], you and your health needs are our priority.  As part of our continuing mission to provide you with exceptional heart care, we have created designated Provider Care Teams.  These Care Teams include your primary Cardiologist (physician) and Advanced Practice Providers (APPs -  Physician Assistants and Nurse Practitioners) who all work together to provide you with the care you need, when you need it. You will need a follow up appointment in 12 months.  Please call our office 2 months in advance to schedule this appointment.  You may see Lesleigh Noe, MD or one of the following Advanced Practice Providers on your designated Care Team:   Norma Fredrickson, NP [name], NP . Georgie Chard, NP  Any Other Special Instructions Will Be Listed Below (If Applicable).  Please monitor your blood pressure for 7 days and call us with those readings or send through MyChart.

## 2018-11-26 ENCOUNTER — Telehealth: Payer: Self-pay | Admitting: Interventional Cardiology

## 2018-11-26 NOTE — Telephone Encounter (Signed)
New Message   Pt was calling to return phone call  Has medical Questions she said she would try to use MyChart Please call

## 2018-11-26 NOTE — Telephone Encounter (Signed)
Returned patient's call. She sent her BP's via MyChart. She is aware they have been sent to Dr. Katrinka Blazing for review. Reminded her of BP target and recommendations by Dr. Katrinka Blazing from last ov. Adv if there are any new recommendations Dr. Michaelle Copas nurse will call her.

## 2018-12-22 DIAGNOSIS — M71571 Other bursitis, not elsewhere classified, right ankle and foot: Secondary | ICD-10-CM | POA: Diagnosis not present

## 2018-12-22 DIAGNOSIS — M722 Plantar fascial fibromatosis: Secondary | ICD-10-CM | POA: Diagnosis not present

## 2018-12-29 DIAGNOSIS — M9902 Segmental and somatic dysfunction of thoracic region: Secondary | ICD-10-CM | POA: Diagnosis not present

## 2018-12-29 DIAGNOSIS — M62830 Muscle spasm of back: Secondary | ICD-10-CM | POA: Diagnosis not present

## 2018-12-29 DIAGNOSIS — M542 Cervicalgia: Secondary | ICD-10-CM | POA: Diagnosis not present

## 2018-12-29 DIAGNOSIS — M9901 Segmental and somatic dysfunction of cervical region: Secondary | ICD-10-CM | POA: Diagnosis not present

## 2019-01-01 DIAGNOSIS — M62830 Muscle spasm of back: Secondary | ICD-10-CM | POA: Diagnosis not present

## 2019-01-01 DIAGNOSIS — M9901 Segmental and somatic dysfunction of cervical region: Secondary | ICD-10-CM | POA: Diagnosis not present

## 2019-01-01 DIAGNOSIS — M542 Cervicalgia: Secondary | ICD-10-CM | POA: Diagnosis not present

## 2019-01-01 DIAGNOSIS — M9902 Segmental and somatic dysfunction of thoracic region: Secondary | ICD-10-CM | POA: Diagnosis not present

## 2019-01-14 DIAGNOSIS — H1045 Other chronic allergic conjunctivitis: Secondary | ICD-10-CM | POA: Diagnosis not present

## 2019-04-29 DIAGNOSIS — M542 Cervicalgia: Secondary | ICD-10-CM | POA: Diagnosis not present

## 2019-04-29 DIAGNOSIS — M9902 Segmental and somatic dysfunction of thoracic region: Secondary | ICD-10-CM | POA: Diagnosis not present

## 2019-04-29 DIAGNOSIS — M9901 Segmental and somatic dysfunction of cervical region: Secondary | ICD-10-CM | POA: Diagnosis not present

## 2019-04-29 DIAGNOSIS — M62830 Muscle spasm of back: Secondary | ICD-10-CM | POA: Diagnosis not present

## 2019-05-06 DIAGNOSIS — M62830 Muscle spasm of back: Secondary | ICD-10-CM | POA: Diagnosis not present

## 2019-05-06 DIAGNOSIS — M9902 Segmental and somatic dysfunction of thoracic region: Secondary | ICD-10-CM | POA: Diagnosis not present

## 2019-05-06 DIAGNOSIS — M9901 Segmental and somatic dysfunction of cervical region: Secondary | ICD-10-CM | POA: Diagnosis not present

## 2019-05-06 DIAGNOSIS — M542 Cervicalgia: Secondary | ICD-10-CM | POA: Diagnosis not present

## 2019-05-13 DIAGNOSIS — M542 Cervicalgia: Secondary | ICD-10-CM | POA: Diagnosis not present

## 2019-05-13 DIAGNOSIS — M9902 Segmental and somatic dysfunction of thoracic region: Secondary | ICD-10-CM | POA: Diagnosis not present

## 2019-05-13 DIAGNOSIS — M9901 Segmental and somatic dysfunction of cervical region: Secondary | ICD-10-CM | POA: Diagnosis not present

## 2019-05-13 DIAGNOSIS — M62830 Muscle spasm of back: Secondary | ICD-10-CM | POA: Diagnosis not present

## 2019-06-05 DIAGNOSIS — I119 Hypertensive heart disease without heart failure: Secondary | ICD-10-CM | POA: Diagnosis not present

## 2019-06-05 DIAGNOSIS — R7303 Prediabetes: Secondary | ICD-10-CM | POA: Diagnosis not present

## 2019-06-05 DIAGNOSIS — E782 Mixed hyperlipidemia: Secondary | ICD-10-CM | POA: Diagnosis not present

## 2019-06-08 ENCOUNTER — Other Ambulatory Visit: Payer: Self-pay | Admitting: Interventional Cardiology

## 2019-06-10 DIAGNOSIS — I252 Old myocardial infarction: Secondary | ICD-10-CM | POA: Diagnosis not present

## 2019-06-10 DIAGNOSIS — I119 Hypertensive heart disease without heart failure: Secondary | ICD-10-CM | POA: Diagnosis not present

## 2019-06-10 DIAGNOSIS — R7303 Prediabetes: Secondary | ICD-10-CM | POA: Diagnosis not present

## 2019-06-10 DIAGNOSIS — Z7189 Other specified counseling: Secondary | ICD-10-CM | POA: Diagnosis not present

## 2019-06-10 DIAGNOSIS — F411 Generalized anxiety disorder: Secondary | ICD-10-CM | POA: Diagnosis not present

## 2019-06-10 DIAGNOSIS — L409 Psoriasis, unspecified: Secondary | ICD-10-CM | POA: Diagnosis not present

## 2019-06-10 DIAGNOSIS — E782 Mixed hyperlipidemia: Secondary | ICD-10-CM | POA: Diagnosis not present

## 2019-06-10 DIAGNOSIS — I2510 Atherosclerotic heart disease of native coronary artery without angina pectoris: Secondary | ICD-10-CM | POA: Diagnosis not present

## 2019-06-10 DIAGNOSIS — A6000 Herpesviral infection of urogenital system, unspecified: Secondary | ICD-10-CM | POA: Diagnosis not present

## 2019-06-10 DIAGNOSIS — K589 Irritable bowel syndrome without diarrhea: Secondary | ICD-10-CM | POA: Diagnosis not present

## 2019-06-10 DIAGNOSIS — Z0000 Encounter for general adult medical examination without abnormal findings: Secondary | ICD-10-CM | POA: Diagnosis not present

## 2019-06-17 DIAGNOSIS — M62830 Muscle spasm of back: Secondary | ICD-10-CM | POA: Diagnosis not present

## 2019-06-17 DIAGNOSIS — M9902 Segmental and somatic dysfunction of thoracic region: Secondary | ICD-10-CM | POA: Diagnosis not present

## 2019-06-17 DIAGNOSIS — M542 Cervicalgia: Secondary | ICD-10-CM | POA: Diagnosis not present

## 2019-06-17 DIAGNOSIS — M9901 Segmental and somatic dysfunction of cervical region: Secondary | ICD-10-CM | POA: Diagnosis not present

## 2019-06-23 ENCOUNTER — Other Ambulatory Visit: Payer: Self-pay | Admitting: Interventional Cardiology

## 2019-06-23 DIAGNOSIS — M9901 Segmental and somatic dysfunction of cervical region: Secondary | ICD-10-CM | POA: Diagnosis not present

## 2019-06-23 DIAGNOSIS — M9902 Segmental and somatic dysfunction of thoracic region: Secondary | ICD-10-CM | POA: Diagnosis not present

## 2019-06-23 DIAGNOSIS — M62830 Muscle spasm of back: Secondary | ICD-10-CM | POA: Diagnosis not present

## 2019-06-23 DIAGNOSIS — M542 Cervicalgia: Secondary | ICD-10-CM | POA: Diagnosis not present

## 2019-07-01 ENCOUNTER — Other Ambulatory Visit: Payer: Self-pay | Admitting: *Deleted

## 2019-07-01 MED ORDER — ATORVASTATIN CALCIUM 40 MG PO TABS: 40.0000 mg | ORAL_TABLET | Freq: Every day | ORAL | 3 refills | Status: DC

## 2019-07-07 DIAGNOSIS — M9901 Segmental and somatic dysfunction of cervical region: Secondary | ICD-10-CM | POA: Diagnosis not present

## 2019-07-07 DIAGNOSIS — M62830 Muscle spasm of back: Secondary | ICD-10-CM | POA: Diagnosis not present

## 2019-07-07 DIAGNOSIS — M542 Cervicalgia: Secondary | ICD-10-CM | POA: Diagnosis not present

## 2019-07-07 DIAGNOSIS — M9902 Segmental and somatic dysfunction of thoracic region: Secondary | ICD-10-CM | POA: Diagnosis not present

## 2019-07-09 DIAGNOSIS — M542 Cervicalgia: Secondary | ICD-10-CM | POA: Diagnosis not present

## 2019-07-09 DIAGNOSIS — M9901 Segmental and somatic dysfunction of cervical region: Secondary | ICD-10-CM | POA: Diagnosis not present

## 2019-07-09 DIAGNOSIS — M9902 Segmental and somatic dysfunction of thoracic region: Secondary | ICD-10-CM | POA: Diagnosis not present

## 2019-07-09 DIAGNOSIS — M62830 Muscle spasm of back: Secondary | ICD-10-CM | POA: Diagnosis not present

## 2019-07-16 DIAGNOSIS — M62830 Muscle spasm of back: Secondary | ICD-10-CM | POA: Diagnosis not present

## 2019-07-16 DIAGNOSIS — M9902 Segmental and somatic dysfunction of thoracic region: Secondary | ICD-10-CM | POA: Diagnosis not present

## 2019-07-16 DIAGNOSIS — M9901 Segmental and somatic dysfunction of cervical region: Secondary | ICD-10-CM | POA: Diagnosis not present

## 2019-07-16 DIAGNOSIS — M542 Cervicalgia: Secondary | ICD-10-CM | POA: Diagnosis not present

## 2019-11-03 DIAGNOSIS — L409 Psoriasis, unspecified: Secondary | ICD-10-CM | POA: Diagnosis not present

## 2019-11-03 DIAGNOSIS — L0293 Carbuncle, unspecified: Secondary | ICD-10-CM | POA: Diagnosis not present

## 2019-11-03 DIAGNOSIS — L821 Other seborrheic keratosis: Secondary | ICD-10-CM | POA: Diagnosis not present

## 2019-11-03 DIAGNOSIS — R109 Unspecified abdominal pain: Secondary | ICD-10-CM | POA: Diagnosis not present

## 2019-11-03 DIAGNOSIS — Z1239 Encounter for other screening for malignant neoplasm of breast: Secondary | ICD-10-CM | POA: Diagnosis not present

## 2019-11-09 ENCOUNTER — Telehealth: Payer: Self-pay | Admitting: Interventional Cardiology

## 2019-11-09 NOTE — Telephone Encounter (Signed)
Pt reports episode on Saturday of left arkm pain occurring most of the day.  Pain resolved without intervention.   She denies any other symptoms or trauma to arm.   BP 164/88 at that time.  Her BP usually runs 120-130/60's.   Pt reporting she feels good today, no complaints.  Pt received notification today from PCP that they are starting her on an antibiotic for UTI.  She will continue to monitor and report if any changes.  D/W urgent issues that need immediate attention including chest pain, SOB.

## 2019-11-09 NOTE — Telephone Encounter (Signed)
Pt c/o BP issue: STAT if pt c/o blurred vision, one-sided weakness or slurred speech  1. What are your last 5 BP readings?  160/88   2. Are you having any other symptoms (ex. Dizziness, headache, blurred vision, passed out)? Pain in left arm down to elbow  3. What is your BP issue? Patient is calling stating she was experiencing pain in left arm down to elbow on the night of 11/07/19. She states her BP was elevated when the pain was occurring. She no longer feels the pain and has not checked BP but feels she needs to be seen in regards to it. Patient has 1 yr f/u scheduled with Dr. Tamala Julian on 01/28/20. She states if she needs to be seen sooner by someone other than Dr. Tamala Julian she prefers it to be Cecilie Kicks.

## 2019-11-10 ENCOUNTER — Other Ambulatory Visit: Payer: Self-pay | Admitting: Family Medicine

## 2019-11-10 DIAGNOSIS — Z1231 Encounter for screening mammogram for malignant neoplasm of breast: Secondary | ICD-10-CM

## 2019-12-09 DIAGNOSIS — H40013 Open angle with borderline findings, low risk, bilateral: Secondary | ICD-10-CM | POA: Diagnosis not present

## 2019-12-11 DIAGNOSIS — E782 Mixed hyperlipidemia: Secondary | ICD-10-CM | POA: Diagnosis not present

## 2019-12-11 DIAGNOSIS — I119 Hypertensive heart disease without heart failure: Secondary | ICD-10-CM | POA: Diagnosis not present

## 2019-12-11 DIAGNOSIS — I2510 Atherosclerotic heart disease of native coronary artery without angina pectoris: Secondary | ICD-10-CM | POA: Diagnosis not present

## 2019-12-11 DIAGNOSIS — R0981 Nasal congestion: Secondary | ICD-10-CM | POA: Diagnosis not present

## 2019-12-11 DIAGNOSIS — R7303 Prediabetes: Secondary | ICD-10-CM | POA: Diagnosis not present

## 2019-12-11 DIAGNOSIS — N390 Urinary tract infection, site not specified: Secondary | ICD-10-CM | POA: Diagnosis not present

## 2019-12-29 ENCOUNTER — Ambulatory Visit
Admission: RE | Admit: 2019-12-29 | Discharge: 2019-12-29 | Disposition: A | Discharge status: Home / Self Care | Payer: PPO | Source: Ambulatory Visit | Attending: Family Medicine | Admitting: Family Medicine

## 2019-12-29 ENCOUNTER — Other Ambulatory Visit: Payer: Self-pay

## 2019-12-29 DIAGNOSIS — Z1231 Encounter for screening mammogram for malignant neoplasm of breast: Secondary | ICD-10-CM

## 2019-12-30 ENCOUNTER — Encounter: Payer: Self-pay | Admitting: Family Medicine

## 2019-12-30 DIAGNOSIS — R319 Hematuria, unspecified: Secondary | ICD-10-CM | POA: Diagnosis not present

## 2019-12-30 DIAGNOSIS — R7309 Other abnormal glucose: Secondary | ICD-10-CM | POA: Diagnosis not present

## 2019-12-30 DIAGNOSIS — E782 Mixed hyperlipidemia: Secondary | ICD-10-CM | POA: Diagnosis not present

## 2019-12-30 DIAGNOSIS — I119 Hypertensive heart disease without heart failure: Secondary | ICD-10-CM | POA: Diagnosis not present

## 2020-01-28 ENCOUNTER — Ambulatory Visit: Payer: PPO | Admitting: Interventional Cardiology

## 2020-02-04 NOTE — Progress Notes (Signed)
Cardiology Office Note:    Date:  02/05/2020   ID:  Maria Lamb, DOB 1951/06/12, MRN [id number]  PCP:  Lupita Raider, MD  Cardiologist:  Lesleigh Noe, MD   Referring MD: Lupita Raider, MD   Chief Complaint  Patient presents with  . Coronary Artery Disease  . Hyperlipidemia    History of Present Illness:    [name] is a 69 y.o. female with a hx of acute coronary syndrome requiring circumflex stent in 2018, hyperlipidemia, and hypertension.  She is doing okay from cardiac standpoint.  She denies angina.  She has not had to use nitroglycerin.  She walks her dogs 3 times per day.  Each walk is between 15 and 30 minutes.  She has experienced no dyspnea or other complaints.  She has had no heart failure symptoms.  Medication regimen is stable.  She has digital data on heart rate and blood pressure.  The blood pressure seems to jump around.  She not infrequently has blood pressures greater than 140 systolic.  Past Medical History:  Diagnosis Date  . Anxiety   . HLD (hyperlipidemia) 05/29/2017  . HSV infection   . Hypertension    Significant white coat syndrome  . NSTEMI (non-ST elevated myocardial infarction) (HCC)   . NSVT (nonsustained ventricular tachycardia) (HCC) 05/29/2017  . Old non-ST elevation MI July 2018   . Psoriasis   . S/P angioplasty with stent, LCX 05/28/17 with DES 05/29/2017    Past Surgical History:  Procedure Laterality Date  . CORONARY STENT INTERVENTION N/A 05/28/2017   Procedure: Coronary Stent Intervention;  Surgeon: Swaziland, Peter M, MD;  Location: [facility] INVASIVE CV LAB;  Service: Cardiovascular;  Laterality: N/A;  . LEFT HEART CATH AND CORONARY ANGIOGRAPHY N/A 05/28/2017   Procedure: Left Heart Cath and Coronary Angiography;  Surgeon: Swaziland, Peter M, MD;  Location: [facility] INVASIVE CV LAB;  Service: Cardiovascular;  Laterality: N/A;  . TONSILLECTOMY AND ADENOIDECTOMY      Current Medications: Current Meds  Medication Sig  . acetaminophen  (TYLENOL) 325 MG tablet Take 2 tablets (650 mg total) by mouth every 4 (four) hours as needed for headache or mild pain.  Marland Kitchen aspirin 81 MG chewable tablet Chew 1 tablet (81 mg total) by mouth daily.  Marland Kitchen atorvastatin (LIPITOR) 20 MG tablet   . Cholecalciferol (VITAMIN D3) 5000 units CAPS Take 5,000 Units by mouth daily.  . metoprolol succinate (TOPROL-XL) 50 MG 24 hr tablet Take 1 tablet (50 mg total) by mouth daily.  . nitroGLYCERIN (NITROSTAT) 0.4 MG SL tablet ONE TAB UNDER TONGUE-MAY REPEAT FOR 2 DOSES EVERY 5 MIN X3TIMES-IF NORESPONSE CALL 911  . Probiotic Product (PROBIOTIC PO) Take 1 tablet by mouth daily.  . Turmeric (QC TUMERIC COMPLEX) 500 MG CAPS Take by mouth.  . [DISCONTINUED] atorvastatin (LIPITOR) 40 MG tablet Take 1 tablet (40 mg total) by mouth daily.     Allergies:   Lipitor [atorvastatin], Lisinopril, and Sulfa antibiotics   Social History   Socioeconomic History  . Marital status: Married    Spouse name: Not on file  . Number of children: 2  . Years of education: Not on file  . Highest education level: Not on file  Occupational History  . Occupation: Social research officer, government  Tobacco Use  . Smoking status: Never Smoker  . Smokeless tobacco: Never Used  Substance and Sexual Activity  . Alcohol use: No  . Drug use: No  . Sexual activity: Not on file  Other Topics Concern  .  Not on file  Social History Narrative  . Not on file   Social Determinants of Health   Financial Resource Strain:   . Difficulty of Paying Living Expenses:   Food Insecurity:   . Worried About Programme researcher, broadcasting/film/video in the Last Year:   . Barista in the Last Year:   Transportation Needs:   . Freight forwarder (Medical):   Marland Kitchen Lack of Transportation (Non-Medical):   Physical Activity:   . Days of Exercise per Week:   . Minutes of Exercise per Session:   Stress:   . Feeling of Stress :   Social Connections:   . Frequency of Communication with Friends and Family:   . Frequency of Social  Gatherings with Friends and Family:   . Attends Religious Services:   . Active Member of Clubs or Organizations:   . Attends Banker Meetings:   Marland Kitchen Marital Status:      Family History: The patient's family history includes Cataracts in her mother; Diabetes Mellitus II in her father; Heart attack (age of onset: 46) in her father and sister; Hypertension in her mother; Macular degeneration in her mother.  ROS:   Please see the history of present illness.    She does not trust the COVID-19 vaccine.  She is not planning on getting it yet.  She feels that she is doing fine with social distancing and mask wearing.  All other systems reviewed and are negative.  EKGs/Labs/Other Studies Reviewed:    The following studies were reviewed today: No new data   EKG:  EKG  demonstrates sinus rhythm and an overall normal appearance.  Recent Labs: No results found for requested labs within last 8760 hours.  Recent Lipid Panel    Component Value Date/Time   CHOL 111 09/23/2017 0816   TRIG 78 09/23/2017 0816   HDL 44 09/23/2017 0816   CHOLHDL 2.5 09/23/2017 0816   CHOLHDL 4.8 05/29/2017 0333   VLDL 25 05/29/2017 0333   LDLCALC 51 09/23/2017 0816    Physical Exam:    VS:  BP (!) 152/78   Pulse 63   Ht 5\' 4"  (1.626 m)   Wt 160 lb 1.9 oz (72.6 kg)   SpO2 97%   BMI 27.48 kg/m     Wt Readings from Last 3 Encounters:  02/05/20 160 lb 1.9 oz (72.6 kg)  11/14/18 160 lb 12.8 oz (72.9 kg)  05/09/18 155 lb 9.6 oz (70.6 kg)     GEN: Healthy-appearing. No acute distress HEENT: Normal NECK: No JVD. LYMPHATICS: No lymphadenopathy CARDIAC:  RRR without murmur, gallop, or edema. VASCULAR:  Normal Pulses. No bruits. RESPIRATORY:  Clear to auscultation without rales, wheezing or rhonchi  ABDOMEN: Soft, non-tender, non-distended, No pulsatile mass, MUSCULOSKELETAL: No deformity  SKIN: Warm and dry NEUROLOGIC:  Alert and oriented x 3 PSYCHIATRIC:  Normal affect   ASSESSMENT:     1. CAD in native artery   2. Essential hypertension   3. Other hyperlipidemia   4. Educated about COVID-19 virus infection    PLAN:    In order of problems listed above:  1. Secondary prevention discussed.  She needs to increase physical activity, and lose weight. 2. The blood pressure is not well controlled.  She feels it is situational.  Her target is 130/70 mmHg or less.  Her therapy is Toprol-XL 50 mg/day.  She will monitor at home and give 05/11/18 blood pressure recordings and a week.  If she is  consistently above 140, adjustment and antihypertensive therapy will be needed. 3. LDL target is less than 70.  Continue atorvastatin 20 mg/day. 4. COVID-19 vaccine is not accepted by the patient.  She will continue to practice social distancing.  Overall education and awareness concerning primary/secondary risk prevention was discussed in detail: LDL less than 70, hemoglobin A1c less than 7, blood pressure target less than 130/80 mmHg, >150 minutes of moderate aerobic activity per week, avoidance of smoking, weight control (via diet and exercise), and continued surveillance/management of/for obstructive sleep apnea.    Medication Adjustments/Labs and Tests Ordered: Current medicines are reviewed at length with the patient today.  Concerns regarding medicines are outlined above.  Orders Placed This Encounter  Procedures  . EKG 12-Lead   No orders of the defined types were placed in this encounter.   Patient Instructions  Medication Instructions:  Your physician recommends that you continue on your current medications as directed. Please refer to the Current Medication list given to you today.  *If you need a refill on your cardiac medications before your next appointment, please call your pharmacy*   Lab Work: None If you have labs (blood work) drawn today and your tests are completely normal, you will receive your results only by: Marland Kitchen MyChart Message (if you have MyChart) OR . A paper  copy in the mail If you have any lab test that is abnormal or we need to change your treatment, we will call you to review the results.   Testing/Procedures: None   Follow-Up: At [facility], you and your health needs are our priority.  As part of our continuing mission to provide you with exceptional heart care, we have created designated Provider Care Teams.  These Care Teams include your primary Cardiologist (physician) and Advanced Practice Providers (APPs -  Physician Assistants and Nurse Practitioners) who all work together to provide you with the care you need, when you need it.  We recommend signing up for the patient portal called "MyChart".  Sign up information is provided on this After Visit Summary.  MyChart is used to connect with patients for Virtual Visits (Telemedicine).  Patients are able to view lab/test results, encounter notes, upcoming appointments, etc.  Non-urgent messages can be sent to your provider as well.   To learn more about what you can do with MyChart, go to [email or web address].    Your next appointment:   12 month(s)  The format for your next appointment:   In Person  Provider:   You may see Sinclair Grooms, MD or one of the following Advanced Practice Providers on your designated Care Team:    Truitt Merle, NP  Cecilie Kicks, NP  Kathyrn Drown, NP    Other Instructions  Monitor blood pressure for 5-6 days and call or MyChart [place] with those readings.     Signed, Sinclair Grooms, MD  02/05/2020 4:07 PM    [facility]

## 2020-02-05 ENCOUNTER — Ambulatory Visit: Payer: PPO | Admitting: Interventional Cardiology

## 2020-02-05 ENCOUNTER — Encounter: Payer: Self-pay | Admitting: Interventional Cardiology

## 2020-02-05 ENCOUNTER — Other Ambulatory Visit: Payer: Self-pay

## 2020-02-05 VITALS — BP 152/78 | HR 63 | Ht 64.0 in | Wt 160.1 lb

## 2020-02-05 DIAGNOSIS — E7849 Other hyperlipidemia: Secondary | ICD-10-CM | POA: Diagnosis not present

## 2020-02-05 DIAGNOSIS — I10 Essential (primary) hypertension: Secondary | ICD-10-CM

## 2020-02-05 DIAGNOSIS — I2510 Atherosclerotic heart disease of native coronary artery without angina pectoris: Secondary | ICD-10-CM | POA: Diagnosis not present

## 2020-02-05 DIAGNOSIS — Z7189 Other specified counseling: Secondary | ICD-10-CM

## 2020-02-05 NOTE — Patient Instructions (Signed)
Medication Instructions:  Your physician recommends that you continue on your current medications as directed. Please refer to the Current Medication list given to you today.  *If you need a refill on your cardiac medications before your next appointment, please call your pharmacy*   Lab Work: None If you have labs (blood work) drawn today and your tests are completely normal, you will receive your results only by: Marland Kitchen MyChart Message (if you have MyChart) OR . A paper copy in the mail If you have any lab test that is abnormal or we need to change your treatment, we will call you to review the results.   Testing/Procedures: None   Follow-Up: At [facility], you and your health needs are our priority.  As part of our continuing mission to provide you with exceptional heart care, we have created designated Provider Care Teams.  These Care Teams include your primary Cardiologist (physician) and Advanced Practice Providers (APPs -  Physician Assistants and Nurse Practitioners) who all work together to provide you with the care you need, when you need it.  We recommend signing up for the patient portal called "MyChart".  Sign up information is provided on this After Visit Summary.  MyChart is used to connect with patients for Virtual Visits (Telemedicine).  Patients are able to view lab/test results, encounter notes, upcoming appointments, etc.  Non-urgent messages can be sent to your provider as well.   To learn more about what you can do with MyChart, go to [email or web address].    Your next appointment:   12 month(s)  The format for your next appointment:   In Person  Provider:   You may see Lesleigh Noe, MD or one of the following Advanced Practice Providers on your designated Care Team:    Norma Fredrickson, NP  [name], NP  Georgie Chard, NP    Other Instructions  Monitor blood pressure for 5-6 days and call or MyChart [place] with those readings.

## 2020-02-25 DIAGNOSIS — H26493 Other secondary cataract, bilateral: Secondary | ICD-10-CM | POA: Diagnosis not present

## 2020-02-25 DIAGNOSIS — H18413 Arcus senilis, bilateral: Secondary | ICD-10-CM | POA: Diagnosis not present

## 2020-02-25 DIAGNOSIS — H43813 Vitreous degeneration, bilateral: Secondary | ICD-10-CM | POA: Diagnosis not present

## 2020-02-25 DIAGNOSIS — H26492 Other secondary cataract, left eye: Secondary | ICD-10-CM | POA: Diagnosis not present

## 2020-02-25 DIAGNOSIS — Z961 Presence of intraocular lens: Secondary | ICD-10-CM | POA: Diagnosis not present

## 2020-03-04 DIAGNOSIS — H26492 Other secondary cataract, left eye: Secondary | ICD-10-CM | POA: Diagnosis not present

## 2020-03-16 ENCOUNTER — Ambulatory Visit: Payer: PPO | Admitting: Interventional Cardiology

## 2020-04-07 DIAGNOSIS — N941 Unspecified dyspareunia: Secondary | ICD-10-CM | POA: Diagnosis not present

## 2020-04-08 ENCOUNTER — Other Ambulatory Visit: Payer: Self-pay | Admitting: Family Medicine

## 2020-04-08 DIAGNOSIS — N941 Unspecified dyspareunia: Secondary | ICD-10-CM

## 2020-04-18 ENCOUNTER — Other Ambulatory Visit: Payer: Self-pay

## 2020-04-18 ENCOUNTER — Ambulatory Visit
Admission: RE | Admit: 2020-04-18 | Discharge: 2020-04-18 | Disposition: A | Discharge status: Home / Self Care | Payer: PPO | Source: Ambulatory Visit | Attending: Family Medicine | Admitting: Family Medicine

## 2020-04-18 DIAGNOSIS — N941 Unspecified dyspareunia: Secondary | ICD-10-CM

## 2020-04-28 ENCOUNTER — Other Ambulatory Visit: Payer: Self-pay | Admitting: Interventional Cardiology

## 2020-04-29 DIAGNOSIS — L259 Unspecified contact dermatitis, unspecified cause: Secondary | ICD-10-CM | POA: Diagnosis not present

## 2020-06-17 DIAGNOSIS — L409 Psoriasis, unspecified: Secondary | ICD-10-CM | POA: Diagnosis not present

## 2020-06-17 DIAGNOSIS — R7309 Other abnormal glucose: Secondary | ICD-10-CM | POA: Diagnosis not present

## 2020-06-17 DIAGNOSIS — A6000 Herpesviral infection of urogenital system, unspecified: Secondary | ICD-10-CM | POA: Diagnosis not present

## 2020-06-17 DIAGNOSIS — I2510 Atherosclerotic heart disease of native coronary artery without angina pectoris: Secondary | ICD-10-CM | POA: Diagnosis not present

## 2020-06-17 DIAGNOSIS — K589 Irritable bowel syndrome without diarrhea: Secondary | ICD-10-CM | POA: Diagnosis not present

## 2020-06-17 DIAGNOSIS — I252 Old myocardial infarction: Secondary | ICD-10-CM | POA: Diagnosis not present

## 2020-06-17 DIAGNOSIS — F411 Generalized anxiety disorder: Secondary | ICD-10-CM | POA: Diagnosis not present

## 2020-06-17 DIAGNOSIS — E782 Mixed hyperlipidemia: Secondary | ICD-10-CM | POA: Diagnosis not present

## 2020-06-17 DIAGNOSIS — Z0000 Encounter for general adult medical examination without abnormal findings: Secondary | ICD-10-CM | POA: Diagnosis not present

## 2020-06-17 DIAGNOSIS — I119 Hypertensive heart disease without heart failure: Secondary | ICD-10-CM | POA: Diagnosis not present

## 2020-06-23 DIAGNOSIS — M9902 Segmental and somatic dysfunction of thoracic region: Secondary | ICD-10-CM | POA: Diagnosis not present

## 2020-06-23 DIAGNOSIS — M542 Cervicalgia: Secondary | ICD-10-CM | POA: Diagnosis not present

## 2020-06-23 DIAGNOSIS — M9901 Segmental and somatic dysfunction of cervical region: Secondary | ICD-10-CM | POA: Diagnosis not present

## 2020-06-23 DIAGNOSIS — M62830 Muscle spasm of back: Secondary | ICD-10-CM | POA: Diagnosis not present

## 2020-07-13 ENCOUNTER — Telehealth: Payer: Self-pay | Admitting: Interventional Cardiology

## 2020-07-13 NOTE — Telephone Encounter (Signed)
Pt was out walking her dog last night and stepped onto a storm drain, turned her ankle and landed on her arm/wrist and cheek hit the pavement.  Very sore today.  Ankle and wrist are swollen. Assured pt that her stent was fine.  Advised her to call PCP about getting an xray of those area to check for breaks.  Pt in agreement with plan.

## 2020-07-13 NOTE — Telephone Encounter (Signed)
Maria Lamb is calling stating she had a fall last night around 9:30 PM and is wanting to know if her stent could have gotten dislodged due to this. She states after the fall she began having simultaneous pain and is wanting to know if she should come in to be seen in regards to it. She is requesting Victorino Dike call her back in regards to this. Please advise.

## 2020-07-19 ENCOUNTER — Other Ambulatory Visit: Payer: Self-pay | Admitting: Interventional Cardiology

## 2020-07-20 ENCOUNTER — Other Ambulatory Visit: Payer: Self-pay | Admitting: Interventional Cardiology

## 2020-07-20 DIAGNOSIS — M9902 Segmental and somatic dysfunction of thoracic region: Secondary | ICD-10-CM | POA: Diagnosis not present

## 2020-07-20 DIAGNOSIS — M9901 Segmental and somatic dysfunction of cervical region: Secondary | ICD-10-CM | POA: Diagnosis not present

## 2020-07-20 DIAGNOSIS — M62830 Muscle spasm of back: Secondary | ICD-10-CM | POA: Diagnosis not present

## 2020-07-20 DIAGNOSIS — M542 Cervicalgia: Secondary | ICD-10-CM | POA: Diagnosis not present

## 2020-07-21 ENCOUNTER — Other Ambulatory Visit: Payer: Self-pay | Admitting: Interventional Cardiology

## 2020-07-21 DIAGNOSIS — M62830 Muscle spasm of back: Secondary | ICD-10-CM | POA: Diagnosis not present

## 2020-07-21 DIAGNOSIS — M9901 Segmental and somatic dysfunction of cervical region: Secondary | ICD-10-CM | POA: Diagnosis not present

## 2020-07-21 DIAGNOSIS — M542 Cervicalgia: Secondary | ICD-10-CM | POA: Diagnosis not present

## 2020-07-21 DIAGNOSIS — M9902 Segmental and somatic dysfunction of thoracic region: Secondary | ICD-10-CM | POA: Diagnosis not present

## 2020-07-22 MED ORDER — METOPROLOL SUCCINATE ER 50 MG PO TB24: 50.0000 mg | ORAL_TABLET | Freq: Every day | ORAL | 1 refills | Status: DC

## 2020-07-22 NOTE — Telephone Encounter (Signed)
Pt's medication was sent to pt's pharmacy as requested. Confirmation received.  °

## 2020-07-25 DIAGNOSIS — M542 Cervicalgia: Secondary | ICD-10-CM | POA: Diagnosis not present

## 2020-07-25 DIAGNOSIS — M62830 Muscle spasm of back: Secondary | ICD-10-CM | POA: Diagnosis not present

## 2020-07-25 DIAGNOSIS — M9901 Segmental and somatic dysfunction of cervical region: Secondary | ICD-10-CM | POA: Diagnosis not present

## 2020-07-25 DIAGNOSIS — M9902 Segmental and somatic dysfunction of thoracic region: Secondary | ICD-10-CM | POA: Diagnosis not present

## 2020-07-27 DIAGNOSIS — M9901 Segmental and somatic dysfunction of cervical region: Secondary | ICD-10-CM | POA: Diagnosis not present

## 2020-07-27 DIAGNOSIS — M62830 Muscle spasm of back: Secondary | ICD-10-CM | POA: Diagnosis not present

## 2020-07-27 DIAGNOSIS — M9902 Segmental and somatic dysfunction of thoracic region: Secondary | ICD-10-CM | POA: Diagnosis not present

## 2020-07-27 DIAGNOSIS — M542 Cervicalgia: Secondary | ICD-10-CM | POA: Diagnosis not present

## 2020-07-28 DIAGNOSIS — M9902 Segmental and somatic dysfunction of thoracic region: Secondary | ICD-10-CM | POA: Diagnosis not present

## 2020-07-28 DIAGNOSIS — M9901 Segmental and somatic dysfunction of cervical region: Secondary | ICD-10-CM | POA: Diagnosis not present

## 2020-07-28 DIAGNOSIS — M542 Cervicalgia: Secondary | ICD-10-CM | POA: Diagnosis not present

## 2020-07-28 DIAGNOSIS — M62830 Muscle spasm of back: Secondary | ICD-10-CM | POA: Diagnosis not present

## 2020-08-09 ENCOUNTER — Other Ambulatory Visit: Payer: Self-pay | Admitting: Interventional Cardiology

## 2020-10-26 DIAGNOSIS — R3915 Urgency of urination: Secondary | ICD-10-CM | POA: Diagnosis not present

## 2020-11-16 DIAGNOSIS — M9902 Segmental and somatic dysfunction of thoracic region: Secondary | ICD-10-CM | POA: Diagnosis not present

## 2020-11-16 DIAGNOSIS — M9901 Segmental and somatic dysfunction of cervical region: Secondary | ICD-10-CM | POA: Diagnosis not present

## 2020-11-16 DIAGNOSIS — M62830 Muscle spasm of back: Secondary | ICD-10-CM | POA: Diagnosis not present

## 2020-11-16 DIAGNOSIS — M542 Cervicalgia: Secondary | ICD-10-CM | POA: Diagnosis not present

## 2020-11-17 DIAGNOSIS — M9901 Segmental and somatic dysfunction of cervical region: Secondary | ICD-10-CM | POA: Diagnosis not present

## 2020-11-17 DIAGNOSIS — M62830 Muscle spasm of back: Secondary | ICD-10-CM | POA: Diagnosis not present

## 2020-11-17 DIAGNOSIS — M542 Cervicalgia: Secondary | ICD-10-CM | POA: Diagnosis not present

## 2020-11-17 DIAGNOSIS — M9902 Segmental and somatic dysfunction of thoracic region: Secondary | ICD-10-CM | POA: Diagnosis not present

## 2020-11-24 DIAGNOSIS — M542 Cervicalgia: Secondary | ICD-10-CM | POA: Diagnosis not present

## 2020-11-24 DIAGNOSIS — M9902 Segmental and somatic dysfunction of thoracic region: Secondary | ICD-10-CM | POA: Diagnosis not present

## 2020-11-24 DIAGNOSIS — N3000 Acute cystitis without hematuria: Secondary | ICD-10-CM | POA: Diagnosis not present

## 2020-11-24 DIAGNOSIS — M62830 Muscle spasm of back: Secondary | ICD-10-CM | POA: Diagnosis not present

## 2020-11-24 DIAGNOSIS — M9901 Segmental and somatic dysfunction of cervical region: Secondary | ICD-10-CM | POA: Diagnosis not present

## 2020-12-01 DIAGNOSIS — M542 Cervicalgia: Secondary | ICD-10-CM | POA: Diagnosis not present

## 2020-12-01 DIAGNOSIS — M9902 Segmental and somatic dysfunction of thoracic region: Secondary | ICD-10-CM | POA: Diagnosis not present

## 2020-12-01 DIAGNOSIS — M9901 Segmental and somatic dysfunction of cervical region: Secondary | ICD-10-CM | POA: Diagnosis not present

## 2020-12-01 DIAGNOSIS — M62830 Muscle spasm of back: Secondary | ICD-10-CM | POA: Diagnosis not present

## 2020-12-07 DIAGNOSIS — M9901 Segmental and somatic dysfunction of cervical region: Secondary | ICD-10-CM | POA: Diagnosis not present

## 2020-12-07 DIAGNOSIS — M542 Cervicalgia: Secondary | ICD-10-CM | POA: Diagnosis not present

## 2020-12-07 DIAGNOSIS — M9902 Segmental and somatic dysfunction of thoracic region: Secondary | ICD-10-CM | POA: Diagnosis not present

## 2020-12-07 DIAGNOSIS — M62830 Muscle spasm of back: Secondary | ICD-10-CM | POA: Diagnosis not present

## 2020-12-08 DIAGNOSIS — M9901 Segmental and somatic dysfunction of cervical region: Secondary | ICD-10-CM | POA: Diagnosis not present

## 2020-12-08 DIAGNOSIS — M542 Cervicalgia: Secondary | ICD-10-CM | POA: Diagnosis not present

## 2020-12-08 DIAGNOSIS — M62830 Muscle spasm of back: Secondary | ICD-10-CM | POA: Diagnosis not present

## 2020-12-08 DIAGNOSIS — M9902 Segmental and somatic dysfunction of thoracic region: Secondary | ICD-10-CM | POA: Diagnosis not present

## 2020-12-16 DIAGNOSIS — Z7189 Other specified counseling: Secondary | ICD-10-CM | POA: Diagnosis not present

## 2020-12-16 DIAGNOSIS — I119 Hypertensive heart disease without heart failure: Secondary | ICD-10-CM | POA: Diagnosis not present

## 2020-12-16 DIAGNOSIS — N390 Urinary tract infection, site not specified: Secondary | ICD-10-CM | POA: Diagnosis not present

## 2020-12-16 DIAGNOSIS — E782 Mixed hyperlipidemia: Secondary | ICD-10-CM | POA: Diagnosis not present

## 2020-12-16 DIAGNOSIS — Z1159 Encounter for screening for other viral diseases: Secondary | ICD-10-CM | POA: Diagnosis not present

## 2020-12-16 DIAGNOSIS — E559 Vitamin D deficiency, unspecified: Secondary | ICD-10-CM | POA: Diagnosis not present

## 2020-12-16 DIAGNOSIS — R7303 Prediabetes: Secondary | ICD-10-CM | POA: Diagnosis not present

## 2021-01-10 DIAGNOSIS — R5383 Other fatigue: Secondary | ICD-10-CM | POA: Diagnosis not present

## 2021-01-10 DIAGNOSIS — U071 COVID-19: Secondary | ICD-10-CM | POA: Diagnosis not present

## 2021-01-16 ENCOUNTER — Other Ambulatory Visit: Payer: Self-pay | Admitting: Interventional Cardiology

## 2021-03-07 NOTE — Progress Notes (Signed)
Cardiology Office Note:    Date:  03/08/2021   ID:  Maria Lamb, DOB 12/06/1950, MRN [id number]  PCP:  Lupita Raider, MD  Cardiologist:  Lesleigh Noe, MD   Referring MD: Lupita Raider, MD   Chief Complaint  Patient presents with  . Coronary Artery Disease  . Hypertension  . Hyperlipidemia    History of Present Illness:    Maria Lamb is a 70 y.o. female with a hx of NSTEMI, NSVT, CAD with CFX stent 2018, hyperlipidemia, and primary hypertension.  She has changed her diet.  She saw nutritionist.  She is working on her intestinal flora.  She denies angina.  No shortness of breath or other complaints that would be consistent with  Past Medical History:  Diagnosis Date  . Anxiety   . HLD (hyperlipidemia) 05/29/2017  . HSV infection   . Hypertension    Significant white coat syndrome  . NSTEMI (non-ST elevated myocardial infarction) (HCC)   . NSVT (nonsustained ventricular tachycardia) (HCC) 05/29/2017  . Old non-ST elevation MI July 2018   . Psoriasis   . S/P angioplasty with stent, LCX 05/28/17 with DES 05/29/2017    Past Surgical History:  Procedure Laterality Date  . CORONARY STENT INTERVENTION N/A 05/28/2017   Procedure: Coronary Stent Intervention;  Surgeon: Swaziland, Peter M, MD;  Location: [facility] INVASIVE CV LAB;  Service: Cardiovascular;  Laterality: N/A;  . LEFT HEART CATH AND CORONARY ANGIOGRAPHY N/A 05/28/2017   Procedure: Left Heart Cath and Coronary Angiography;  Surgeon: Swaziland, Peter M, MD;  Location: [facility] INVASIVE CV LAB;  Service: Cardiovascular;  Laterality: N/A;  . TONSILLECTOMY AND ADENOIDECTOMY      Current Medications: No outpatient medications have been marked as taking for the 03/08/21 encounter (Office Visit) with Lyn Records, MD.     Allergies:   Lipitor [atorvastatin], Lisinopril, and Sulfa antibiotics   Social History   Socioeconomic History  . Marital status: Married    Spouse name: Not on file  . Number of children: 2  .  Years of education: Not on file  . Highest education level: Not on file  Occupational History  . Occupation: Social research officer, government  Tobacco Use  . Smoking status: Never Smoker  . Smokeless tobacco: Never Used  Vaping Use  . Vaping Use: Never used  Substance and Sexual Activity  . Alcohol use: No  . Drug use: No  . Sexual activity: Not on file  Other Topics Concern  . Not on file  Social History Narrative  . Not on file   Social Determinants of Health   Financial Resource Strain: Not on file  Food Insecurity: Not on file  Transportation Needs: Not on file  Physical Activity: Not on file  Stress: Not on file  Social Connections: Not on file     Family History: The patient's family history includes Cataracts in her mother; Diabetes Mellitus II in her father; Heart attack (age of onset: 2) in her father and sister; Hypertension in her mother; Macular degeneration in her mother.  ROS:   Please see the history of present illness.    Her mother had a stroke this past weekend.  She is not been left with any major deficits although her speech is not normal.  She walks 45 minutes daily.  She gets greater than 150 minutes of moderate activity per week.  No medication side effects.  Denies claudication and neurological symptoms.  All other systems reviewed and are negative.  EKGs/Labs/Other Studies  Reviewed:    The following studies were reviewed today:  ECHOCARDIOGRAM 2018: Study Conclusions   - Left ventricle: The cavity size was normal. There was mild  concentric hypertrophy. Systolic function was normal. The  estimated ejection fraction was in the range of 60% to 65%. Wall  motion was normal; there were no regional wall motion  abnormalities. Doppler parameters are consistent with abnormal  left ventricular relaxation (grade 1 diastolic dysfunction).  There was no evidence of elevated ventricular filling pressure by  Doppler parameters.  - Aortic valve: There was  mild regurgitation.  - Aortic root: The aortic root was normal in size.  - Left atrium: The atrium was normal in size.  - Right ventricle: Systolic function was normal.  - Tricuspid valve: There was no regurgitation.  - Pulmonary arteries: Systolic pressure could not be accurately  estimated.  - Inferior vena cava: The vessel was normal in size.  - Pericardium, extracardiac: There was no pericardial effusion.   EKG:  EKG   normal sinus rhythm with normal appearance  Recent Labs: No results found for requested labs within last 8760 hours.  Recent Lipid Panel    Component Value Date/Time   CHOL 111 09/23/2017 0816   TRIG 78 09/23/2017 0816   HDL 44 09/23/2017 0816   CHOLHDL 2.5 09/23/2017 0816   CHOLHDL 4.8 05/29/2017 0333   VLDL 25 05/29/2017 0333   LDLCALC 51 09/23/2017 0816    Physical Exam:    VS:  BP (!) 142/72   Pulse 69   Ht 5\' 4"  (1.626 m)   Wt 154 lb 3.2 oz (69.9 kg)   SpO2 96%   BMI 26.47 kg/m     Wt Readings from Last 3 Encounters:  03/08/21 154 lb 3.2 oz (69.9 kg)  02/05/20 160 lb 1.9 oz (72.6 kg)  11/14/18 160 lb 12.8 oz (72.9 kg)     GEN: Healthy-appearing. No acute distress HEENT: Normal NECK: No JVD. LYMPHATICS: No lymphadenopathy CARDIAC: No murmur. RRR no gallop, or edema. VASCULAR:  Normal Pulses. No bruits. RESPIRATORY:  Clear to auscultation without rales, wheezing or rhonchi  ABDOMEN: Soft, non-tender, non-distended, No pulsatile mass, MUSCULOSKELETAL: No deformity  SKIN: Warm and dry NEUROLOGIC:  Alert and oriented x 3 PSYCHIATRIC:  Normal affect   ASSESSMENT:    1. CAD in native artery   2. Essential hypertension   3. Other hyperlipidemia   4. Diastolic dysfunction    PLAN:    In order of problems listed above:  1. Secondary prevention reviewed 2. She monitors blood pressure at home.  In office blood pressures are usually higher.  Her log demonstrates that systolic pressures usually run 125 to 130 mmHg.  Diastolics always less  than 80. 3. LDL was 74 and triglyceride 154 in February.  Dietary changes are now being pursued.  Will be checked again later this summer.  She understands the targets.  Vascepa may be necessary if triglyceride remains elevated. 4. No symptoms to suggest diastolic dysfunction or volume overload.  Overall education and awareness concerning secondary risk prevention was discussed in detail: LDL less than 70, hemoglobin A1c less than 7, blood pressure target less than 130/80 mmHg, >150 minutes of moderate aerobic activity per week, avoidance of smoking, weight control (via diet and exercise), and continued surveillance/management of/for obstructive sleep apnea.    Medication Adjustments/Labs and Tests Ordered: Current medicines are reviewed at length with the patient today.  Concerns regarding medicines are outlined above.  No orders of the defined  types were placed in this encounter.  No orders of the defined types were placed in this encounter.   Patient Instructions  Medication Instructions:  Your physician recommends that you continue on your current medications as directed. Please refer to the Current Medication list given to you today.  *If you need a refill on your cardiac medications before your next appointment, please call your pharmacy*   Lab Work: None If you have labs (blood work) drawn today and your tests are completely normal, you will receive your results only by: Marland Kitchen MyChart Message (if you have MyChart) OR . A paper copy in the mail If you have any lab test that is abnormal or we need to change your treatment, we will call you to review the results.   Testing/Procedures: None   Follow-Up: At [facility], you and your health needs are our priority.  As part of our continuing mission to provide you with exceptional heart care, we have created designated Provider Care Teams.  These Care Teams include your primary Cardiologist (physician) and Advanced Practice  Providers (APPs -  Physician Assistants and Nurse Practitioners) who all work together to provide you with the care you need, when you need it.  We recommend signing up for the patient portal called "MyChart".  Sign up information is provided on this After Visit Summary.  MyChart is used to connect with patients for Virtual Visits (Telemedicine).  Patients are able to view lab/test results, encounter notes, upcoming appointments, etc.  Non-urgent messages can be sent to your provider as well.   To learn more about what you can do with MyChart, go to [email or web address].    Your next appointment:   1 year(s)  The format for your next appointment:   In Person  Provider:   You may see Lesleigh Noe, MD or one of the following Advanced Practice Providers on your designated Care Team:    Georgie Chard, NP    Other Instructions      Signed, Lesleigh Noe, MD  03/08/2021 10:00 AM    [facility]

## 2021-03-08 ENCOUNTER — Ambulatory Visit: Payer: PPO | Admitting: Interventional Cardiology

## 2021-03-08 ENCOUNTER — Other Ambulatory Visit: Payer: Self-pay

## 2021-03-08 ENCOUNTER — Encounter: Payer: Self-pay | Admitting: Interventional Cardiology

## 2021-03-08 VITALS — BP 142/72 | HR 69 | Ht 64.0 in | Wt 154.2 lb

## 2021-03-08 DIAGNOSIS — E7849 Other hyperlipidemia: Secondary | ICD-10-CM | POA: Diagnosis not present

## 2021-03-08 DIAGNOSIS — I2510 Atherosclerotic heart disease of native coronary artery without angina pectoris: Secondary | ICD-10-CM | POA: Diagnosis not present

## 2021-03-08 DIAGNOSIS — I5189 Other ill-defined heart diseases: Secondary | ICD-10-CM | POA: Diagnosis not present

## 2021-03-08 DIAGNOSIS — I10 Essential (primary) hypertension: Secondary | ICD-10-CM

## 2021-03-08 NOTE — Patient Instructions (Signed)

## 2021-03-08 NOTE — Addendum Note (Signed)
Addended by: Julio Sicks on: 03/08/2021 12:33 PM   Modules accepted: Orders

## 2021-04-12 DIAGNOSIS — M9901 Segmental and somatic dysfunction of cervical region: Secondary | ICD-10-CM | POA: Diagnosis not present

## 2021-04-12 DIAGNOSIS — M542 Cervicalgia: Secondary | ICD-10-CM | POA: Diagnosis not present

## 2021-04-12 DIAGNOSIS — M62830 Muscle spasm of back: Secondary | ICD-10-CM | POA: Diagnosis not present

## 2021-04-12 DIAGNOSIS — M9902 Segmental and somatic dysfunction of thoracic region: Secondary | ICD-10-CM | POA: Diagnosis not present

## 2021-04-17 DIAGNOSIS — M542 Cervicalgia: Secondary | ICD-10-CM | POA: Diagnosis not present

## 2021-04-17 DIAGNOSIS — M9902 Segmental and somatic dysfunction of thoracic region: Secondary | ICD-10-CM | POA: Diagnosis not present

## 2021-04-17 DIAGNOSIS — M62830 Muscle spasm of back: Secondary | ICD-10-CM | POA: Diagnosis not present

## 2021-04-17 DIAGNOSIS — M9901 Segmental and somatic dysfunction of cervical region: Secondary | ICD-10-CM | POA: Diagnosis not present

## 2021-04-18 DIAGNOSIS — M9902 Segmental and somatic dysfunction of thoracic region: Secondary | ICD-10-CM | POA: Diagnosis not present

## 2021-04-18 DIAGNOSIS — M62830 Muscle spasm of back: Secondary | ICD-10-CM | POA: Diagnosis not present

## 2021-04-18 DIAGNOSIS — M9901 Segmental and somatic dysfunction of cervical region: Secondary | ICD-10-CM | POA: Diagnosis not present

## 2021-04-18 DIAGNOSIS — M542 Cervicalgia: Secondary | ICD-10-CM | POA: Diagnosis not present

## 2021-04-19 DIAGNOSIS — M542 Cervicalgia: Secondary | ICD-10-CM | POA: Diagnosis not present

## 2021-04-19 DIAGNOSIS — M62830 Muscle spasm of back: Secondary | ICD-10-CM | POA: Diagnosis not present

## 2021-04-19 DIAGNOSIS — M9901 Segmental and somatic dysfunction of cervical region: Secondary | ICD-10-CM | POA: Diagnosis not present

## 2021-04-19 DIAGNOSIS — M9902 Segmental and somatic dysfunction of thoracic region: Secondary | ICD-10-CM | POA: Diagnosis not present

## 2021-04-24 DIAGNOSIS — M9902 Segmental and somatic dysfunction of thoracic region: Secondary | ICD-10-CM | POA: Diagnosis not present

## 2021-04-24 DIAGNOSIS — M9901 Segmental and somatic dysfunction of cervical region: Secondary | ICD-10-CM | POA: Diagnosis not present

## 2021-04-24 DIAGNOSIS — M542 Cervicalgia: Secondary | ICD-10-CM | POA: Diagnosis not present

## 2021-04-24 DIAGNOSIS — M62830 Muscle spasm of back: Secondary | ICD-10-CM | POA: Diagnosis not present

## 2021-04-26 DIAGNOSIS — M9901 Segmental and somatic dysfunction of cervical region: Secondary | ICD-10-CM | POA: Diagnosis not present

## 2021-04-26 DIAGNOSIS — M542 Cervicalgia: Secondary | ICD-10-CM | POA: Diagnosis not present

## 2021-04-26 DIAGNOSIS — M9902 Segmental and somatic dysfunction of thoracic region: Secondary | ICD-10-CM | POA: Diagnosis not present

## 2021-04-26 DIAGNOSIS — M62830 Muscle spasm of back: Secondary | ICD-10-CM | POA: Diagnosis not present

## 2021-05-02 DIAGNOSIS — M542 Cervicalgia: Secondary | ICD-10-CM | POA: Diagnosis not present

## 2021-05-02 DIAGNOSIS — M9901 Segmental and somatic dysfunction of cervical region: Secondary | ICD-10-CM | POA: Diagnosis not present

## 2021-05-02 DIAGNOSIS — M9902 Segmental and somatic dysfunction of thoracic region: Secondary | ICD-10-CM | POA: Diagnosis not present

## 2021-05-02 DIAGNOSIS — M62830 Muscle spasm of back: Secondary | ICD-10-CM | POA: Diagnosis not present

## 2021-05-03 DIAGNOSIS — M62830 Muscle spasm of back: Secondary | ICD-10-CM | POA: Diagnosis not present

## 2021-05-03 DIAGNOSIS — M9901 Segmental and somatic dysfunction of cervical region: Secondary | ICD-10-CM | POA: Diagnosis not present

## 2021-05-03 DIAGNOSIS — M9902 Segmental and somatic dysfunction of thoracic region: Secondary | ICD-10-CM | POA: Diagnosis not present

## 2021-05-03 DIAGNOSIS — M542 Cervicalgia: Secondary | ICD-10-CM | POA: Diagnosis not present

## 2021-05-10 DIAGNOSIS — M62830 Muscle spasm of back: Secondary | ICD-10-CM | POA: Diagnosis not present

## 2021-05-10 DIAGNOSIS — M9901 Segmental and somatic dysfunction of cervical region: Secondary | ICD-10-CM | POA: Diagnosis not present

## 2021-05-10 DIAGNOSIS — M542 Cervicalgia: Secondary | ICD-10-CM | POA: Diagnosis not present

## 2021-05-10 DIAGNOSIS — M9902 Segmental and somatic dysfunction of thoracic region: Secondary | ICD-10-CM | POA: Diagnosis not present

## 2021-05-17 DIAGNOSIS — R350 Frequency of micturition: Secondary | ICD-10-CM | POA: Diagnosis not present

## 2021-06-19 DIAGNOSIS — Z87440 Personal history of urinary (tract) infections: Secondary | ICD-10-CM | POA: Diagnosis not present

## 2021-06-19 DIAGNOSIS — E782 Mixed hyperlipidemia: Secondary | ICD-10-CM | POA: Diagnosis not present

## 2021-06-19 DIAGNOSIS — A6000 Herpesviral infection of urogenital system, unspecified: Secondary | ICD-10-CM | POA: Diagnosis not present

## 2021-06-19 DIAGNOSIS — F411 Generalized anxiety disorder: Secondary | ICD-10-CM | POA: Diagnosis not present

## 2021-06-19 DIAGNOSIS — Z0000 Encounter for general adult medical examination without abnormal findings: Secondary | ICD-10-CM | POA: Diagnosis not present

## 2021-06-19 DIAGNOSIS — I2510 Atherosclerotic heart disease of native coronary artery without angina pectoris: Secondary | ICD-10-CM | POA: Diagnosis not present

## 2021-06-19 DIAGNOSIS — I252 Old myocardial infarction: Secondary | ICD-10-CM | POA: Diagnosis not present

## 2021-06-19 DIAGNOSIS — I119 Hypertensive heart disease without heart failure: Secondary | ICD-10-CM | POA: Diagnosis not present

## 2021-06-19 DIAGNOSIS — L409 Psoriasis, unspecified: Secondary | ICD-10-CM | POA: Diagnosis not present

## 2021-06-19 DIAGNOSIS — K589 Irritable bowel syndrome without diarrhea: Secondary | ICD-10-CM | POA: Diagnosis not present

## 2021-06-19 DIAGNOSIS — R7303 Prediabetes: Secondary | ICD-10-CM | POA: Diagnosis not present

## 2021-07-03 DIAGNOSIS — M542 Cervicalgia: Secondary | ICD-10-CM | POA: Diagnosis not present

## 2021-07-03 DIAGNOSIS — M62830 Muscle spasm of back: Secondary | ICD-10-CM | POA: Diagnosis not present

## 2021-07-03 DIAGNOSIS — M9901 Segmental and somatic dysfunction of cervical region: Secondary | ICD-10-CM | POA: Diagnosis not present

## 2021-07-03 DIAGNOSIS — M9902 Segmental and somatic dysfunction of thoracic region: Secondary | ICD-10-CM | POA: Diagnosis not present

## 2021-07-04 DIAGNOSIS — M62830 Muscle spasm of back: Secondary | ICD-10-CM | POA: Diagnosis not present

## 2021-07-04 DIAGNOSIS — M9901 Segmental and somatic dysfunction of cervical region: Secondary | ICD-10-CM | POA: Diagnosis not present

## 2021-07-04 DIAGNOSIS — M542 Cervicalgia: Secondary | ICD-10-CM | POA: Diagnosis not present

## 2021-07-04 DIAGNOSIS — M9902 Segmental and somatic dysfunction of thoracic region: Secondary | ICD-10-CM | POA: Diagnosis not present

## 2021-08-01 ENCOUNTER — Other Ambulatory Visit: Payer: Self-pay | Admitting: Interventional Cardiology

## 2021-08-02 DIAGNOSIS — E119 Type 2 diabetes mellitus without complications: Secondary | ICD-10-CM | POA: Diagnosis not present

## 2021-08-29 DIAGNOSIS — R350 Frequency of micturition: Secondary | ICD-10-CM | POA: Diagnosis not present

## 2021-09-26 DIAGNOSIS — M9902 Segmental and somatic dysfunction of thoracic region: Secondary | ICD-10-CM | POA: Diagnosis not present

## 2021-09-26 DIAGNOSIS — M9901 Segmental and somatic dysfunction of cervical region: Secondary | ICD-10-CM | POA: Diagnosis not present

## 2021-09-26 DIAGNOSIS — M62830 Muscle spasm of back: Secondary | ICD-10-CM | POA: Diagnosis not present

## 2021-09-26 DIAGNOSIS — M542 Cervicalgia: Secondary | ICD-10-CM | POA: Diagnosis not present

## 2021-09-27 DIAGNOSIS — M9901 Segmental and somatic dysfunction of cervical region: Secondary | ICD-10-CM | POA: Diagnosis not present

## 2021-09-27 DIAGNOSIS — R0789 Other chest pain: Secondary | ICD-10-CM | POA: Diagnosis not present

## 2021-09-27 DIAGNOSIS — N390 Urinary tract infection, site not specified: Secondary | ICD-10-CM | POA: Diagnosis not present

## 2021-09-27 DIAGNOSIS — M542 Cervicalgia: Secondary | ICD-10-CM | POA: Diagnosis not present

## 2021-09-27 DIAGNOSIS — M9902 Segmental and somatic dysfunction of thoracic region: Secondary | ICD-10-CM | POA: Diagnosis not present

## 2021-09-27 DIAGNOSIS — M62830 Muscle spasm of back: Secondary | ICD-10-CM | POA: Diagnosis not present

## 2021-09-27 DIAGNOSIS — R140 Abdominal distension (gaseous): Secondary | ICD-10-CM | POA: Diagnosis not present

## 2021-11-08 DIAGNOSIS — M9902 Segmental and somatic dysfunction of thoracic region: Secondary | ICD-10-CM | POA: Diagnosis not present

## 2021-11-08 DIAGNOSIS — M542 Cervicalgia: Secondary | ICD-10-CM | POA: Diagnosis not present

## 2021-11-08 DIAGNOSIS — M9901 Segmental and somatic dysfunction of cervical region: Secondary | ICD-10-CM | POA: Diagnosis not present

## 2021-11-08 DIAGNOSIS — M62830 Muscle spasm of back: Secondary | ICD-10-CM | POA: Diagnosis not present

## 2021-11-15 DIAGNOSIS — M62830 Muscle spasm of back: Secondary | ICD-10-CM | POA: Diagnosis not present

## 2021-11-15 DIAGNOSIS — M9901 Segmental and somatic dysfunction of cervical region: Secondary | ICD-10-CM | POA: Diagnosis not present

## 2021-11-15 DIAGNOSIS — M542 Cervicalgia: Secondary | ICD-10-CM | POA: Diagnosis not present

## 2021-11-15 DIAGNOSIS — M9902 Segmental and somatic dysfunction of thoracic region: Secondary | ICD-10-CM | POA: Diagnosis not present

## 2021-11-22 DIAGNOSIS — M9901 Segmental and somatic dysfunction of cervical region: Secondary | ICD-10-CM | POA: Diagnosis not present

## 2021-11-22 DIAGNOSIS — M62830 Muscle spasm of back: Secondary | ICD-10-CM | POA: Diagnosis not present

## 2021-11-22 DIAGNOSIS — M542 Cervicalgia: Secondary | ICD-10-CM | POA: Diagnosis not present

## 2021-11-22 DIAGNOSIS — M9902 Segmental and somatic dysfunction of thoracic region: Secondary | ICD-10-CM | POA: Diagnosis not present

## 2021-11-29 DIAGNOSIS — M542 Cervicalgia: Secondary | ICD-10-CM | POA: Diagnosis not present

## 2021-11-29 DIAGNOSIS — M9901 Segmental and somatic dysfunction of cervical region: Secondary | ICD-10-CM | POA: Diagnosis not present

## 2021-11-29 DIAGNOSIS — M62830 Muscle spasm of back: Secondary | ICD-10-CM | POA: Diagnosis not present

## 2021-11-29 DIAGNOSIS — M9902 Segmental and somatic dysfunction of thoracic region: Secondary | ICD-10-CM | POA: Diagnosis not present

## 2021-12-06 DIAGNOSIS — M9901 Segmental and somatic dysfunction of cervical region: Secondary | ICD-10-CM | POA: Diagnosis not present

## 2021-12-06 DIAGNOSIS — M9902 Segmental and somatic dysfunction of thoracic region: Secondary | ICD-10-CM | POA: Diagnosis not present

## 2021-12-06 DIAGNOSIS — M62830 Muscle spasm of back: Secondary | ICD-10-CM | POA: Diagnosis not present

## 2021-12-06 DIAGNOSIS — M542 Cervicalgia: Secondary | ICD-10-CM | POA: Diagnosis not present

## 2021-12-13 DIAGNOSIS — M62830 Muscle spasm of back: Secondary | ICD-10-CM | POA: Diagnosis not present

## 2021-12-13 DIAGNOSIS — M9901 Segmental and somatic dysfunction of cervical region: Secondary | ICD-10-CM | POA: Diagnosis not present

## 2021-12-13 DIAGNOSIS — M542 Cervicalgia: Secondary | ICD-10-CM | POA: Diagnosis not present

## 2021-12-13 DIAGNOSIS — M9902 Segmental and somatic dysfunction of thoracic region: Secondary | ICD-10-CM | POA: Diagnosis not present

## 2021-12-20 DIAGNOSIS — I2510 Atherosclerotic heart disease of native coronary artery without angina pectoris: Secondary | ICD-10-CM | POA: Diagnosis not present

## 2021-12-20 DIAGNOSIS — I119 Hypertensive heart disease without heart failure: Secondary | ICD-10-CM | POA: Diagnosis not present

## 2021-12-20 DIAGNOSIS — E782 Mixed hyperlipidemia: Secondary | ICD-10-CM | POA: Diagnosis not present

## 2021-12-20 DIAGNOSIS — L409 Psoriasis, unspecified: Secondary | ICD-10-CM | POA: Diagnosis not present

## 2021-12-20 DIAGNOSIS — R7303 Prediabetes: Secondary | ICD-10-CM | POA: Diagnosis not present

## 2022-01-04 DIAGNOSIS — Z23 Encounter for immunization: Secondary | ICD-10-CM | POA: Diagnosis not present

## 2022-01-04 DIAGNOSIS — L409 Psoriasis, unspecified: Secondary | ICD-10-CM | POA: Diagnosis not present

## 2022-01-09 DIAGNOSIS — L409 Psoriasis, unspecified: Secondary | ICD-10-CM | POA: Diagnosis not present

## 2022-01-11 DIAGNOSIS — L409 Psoriasis, unspecified: Secondary | ICD-10-CM | POA: Diagnosis not present

## 2022-01-19 DIAGNOSIS — T300 Burn of unspecified body region, unspecified degree: Secondary | ICD-10-CM | POA: Diagnosis not present

## 2022-01-19 DIAGNOSIS — L568 Other specified acute skin changes due to ultraviolet radiation: Secondary | ICD-10-CM | POA: Diagnosis not present

## 2022-02-02 ENCOUNTER — Other Ambulatory Visit: Payer: Self-pay | Admitting: Interventional Cardiology

## 2022-02-02 NOTE — Telephone Encounter (Signed)
Pt's medication was sent to pt's pharmacy as requested. Called pt and stated that she takes 25 mg tablets, 2 tablet daily total 50 mg daily. Confirmation received.  ?

## 2022-03-08 DIAGNOSIS — B009 Herpesviral infection, unspecified: Secondary | ICD-10-CM | POA: Diagnosis not present

## 2022-03-08 DIAGNOSIS — L810 Postinflammatory hyperpigmentation: Secondary | ICD-10-CM | POA: Diagnosis not present

## 2022-03-08 DIAGNOSIS — T889XXD Complication of surgical and medical care, unspecified, subsequent encounter: Secondary | ICD-10-CM | POA: Diagnosis not present

## 2022-03-08 DIAGNOSIS — T300 Burn of unspecified body region, unspecified degree: Secondary | ICD-10-CM | POA: Diagnosis not present

## 2022-03-08 DIAGNOSIS — L409 Psoriasis, unspecified: Secondary | ICD-10-CM | POA: Diagnosis not present

## 2022-03-08 DIAGNOSIS — L299 Pruritus, unspecified: Secondary | ICD-10-CM | POA: Diagnosis not present

## 2022-04-06 DIAGNOSIS — E782 Mixed hyperlipidemia: Secondary | ICD-10-CM | POA: Diagnosis not present

## 2022-05-02 DIAGNOSIS — M62830 Muscle spasm of back: Secondary | ICD-10-CM | POA: Diagnosis not present

## 2022-05-02 DIAGNOSIS — M9901 Segmental and somatic dysfunction of cervical region: Secondary | ICD-10-CM | POA: Diagnosis not present

## 2022-05-02 DIAGNOSIS — M542 Cervicalgia: Secondary | ICD-10-CM | POA: Diagnosis not present

## 2022-05-02 DIAGNOSIS — M9902 Segmental and somatic dysfunction of thoracic region: Secondary | ICD-10-CM | POA: Diagnosis not present

## 2022-05-04 ENCOUNTER — Other Ambulatory Visit: Payer: Self-pay | Admitting: Interventional Cardiology

## 2022-05-08 DIAGNOSIS — M9901 Segmental and somatic dysfunction of cervical region: Secondary | ICD-10-CM | POA: Diagnosis not present

## 2022-05-08 DIAGNOSIS — M542 Cervicalgia: Secondary | ICD-10-CM | POA: Diagnosis not present

## 2022-05-08 DIAGNOSIS — M9902 Segmental and somatic dysfunction of thoracic region: Secondary | ICD-10-CM | POA: Diagnosis not present

## 2022-05-08 DIAGNOSIS — M62830 Muscle spasm of back: Secondary | ICD-10-CM | POA: Diagnosis not present

## 2022-05-29 NOTE — Progress Notes (Signed)
Cardiology Office Note:    Date:  05/31/2022   ID:  Maria Lamb, DOB Nov 04, 1951, MRN [id number]  PCP:  Mayra Neer, MD  Cardiologist:  Sinclair Grooms, MD   Referring MD: Mayra Neer, MD   Chief Complaint  Patient presents with   Coronary Artery Disease   Hypertension   Hyperlipidemia    History of Present Illness:    Maria Lamb is a 71 y.o. female with a hx of NSTEMI, NSVT, CAD with CFX stent 2018, hyperlipidemia, and primary hypertension.  He was last seen a year ago.  No interval encounters in the emergency room or at heart care.  No real cardiac complaints.  Walks about 1-1/2 miles 7 days a week with her dogs.  No dyspnea or chest discomfort.  Also does yoga.  No medication side effects.  Past Medical History:  Diagnosis Date   Anxiety    HLD (hyperlipidemia) 05/29/2017   HSV infection    Hypertension    Significant white coat syndrome   NSTEMI (non-ST elevated myocardial infarction) [facility])    NSVT (nonsustained ventricular tachycardia) ([place]) 05/29/2017   Old non-ST elevation MI July 2018    Psoriasis    S/P angioplasty with stent, LCX 05/28/17 with DES 05/29/2017    Past Surgical History:  Procedure Laterality Date   CORONARY STENT INTERVENTION N/A 05/28/2017   Procedure: Coronary Stent Intervention;  Surgeon: Martinique, Peter M, MD;  Location: [place] [facility];  Service: Cardiovascular;  Laterality: N/A;   LEFT HEART CATH AND CORONARY ANGIOGRAPHY N/A 05/28/2017   Procedure: Left Heart Cath and Coronary Angiography;  Surgeon: Martinique, Peter M, MD;  Location: [place] [facility];  Service: Cardiovascular;  Laterality: N/A;   TONSILLECTOMY AND ADENOIDECTOMY      Current Medications: Current Meds  Medication Sig   acetaminophen (TYLENOL) 325 MG tablet Take 2 tablets (650 mg total) by mouth every 4 (four) hours as needed for headache or mild pain.   aspirin 81 MG chewable tablet Chew 1 tablet (81 mg total) by mouth daily.   atorvastatin (LIPITOR) 20 MG  tablet TAKE (2) TABLETS BY MOUTH DAILY.   Cholecalciferol (VITAMIN D3) 5000 units CAPS Take 5,000 Units by mouth daily.   metoprolol succinate (TOPROL-XL) 25 MG 24 hr tablet Take 2 tablets (50 mg total) by mouth daily. (Patient taking differently: Take 50 mg by mouth daily. Pt. Takes 2 tablet at breakfast.)   nitroGLYCERIN (NITROSTAT) 0.4 MG SL tablet ONE TAB UNDER TONGUE-MAY REPEAT FOR 2 DOSES EVERY 5 MIN X3TIMES-IF NO RESPONSE CALL 911   Probiotic Product (PROBIOTIC PO) Take 1 tablet by mouth daily.   Turmeric (QC TUMERIC COMPLEX) 500 MG CAPS Take by mouth.     Allergies:   Lipitor [atorvastatin], Lisinopril, and Sulfa antibiotics   Social History   Socioeconomic History   Marital status: Married    Spouse name: Not on file   Number of children: 2   Years of education: Not on file   Highest education level: Not on file  Occupational History   Occupation: Community education officer  Tobacco Use   Smoking status: Never   Smokeless tobacco: Never  Vaping Use   Vaping Use: Never used  Substance and Sexual Activity   Alcohol use: No   Drug use: No   Sexual activity: Not on file  Other Topics Concern   Not on file  Social History Narrative   Not on file   Social Determinants of Radio broadcast assistant  Strain: Not on file  Food Insecurity: Not on file  Transportation Needs: Not on file  Physical Activity: Not on file  Stress: Not on file  Social Connections: Not on file     Family History: The patient's family history includes Cataracts in her mother; Diabetes Mellitus II in her father; Heart attack (age of onset: 66) in her father and sister; Hypertension in her mother; Macular degeneration in her mother.  ROS:   Please see the history of present illness.    Concerned about superficial varicosities around her ankles.  Recent outbreak of poison ivy.  All other systems reviewed and are negative.  EKGs/Labs/Other Studies Reviewed:    The following studies were reviewed  today: No recent imaging.  Last echo done in 2018.  EF was normal.  In absence of symptoms, this does not need to be repeated currently.  EKG:  EKG normal sinus rhythm, with normal appearance.  When compared to April 2022, no significant change has occurred.  Recent Labs: No results found for requested labs within last 365 days.  Recent Lipid Panel    Component Value Date/Time   CHOL 111 09/23/2017 0816   TRIG 78 09/23/2017 0816   HDL 44 09/23/2017 0816   CHOLHDL 2.5 09/23/2017 0816   CHOLHDL 4.8 05/29/2017 0333   VLDL 25 05/29/2017 0333   LDLCALC 51 09/23/2017 0816    Physical Exam:    VS:  BP 138/78   Pulse 74   Ht 5\' 4"  (1.626 m)   Wt 165 lb 6.4 oz (75 kg)   SpO2 94%   BMI 28.39 kg/m     Wt Readings from Last 3 Encounters:  05/31/22 165 lb 6.4 oz (75 kg)  03/08/21 154 lb 3.2 oz (69.9 kg)  02/05/20 160 lb 1.9 oz (72.6 kg)     GEN: Overweight. No acute distress HEENT: Normal NECK: No JVD. LYMPHATICS: No lymphadenopathy CARDIAC: No murmur. RRR no gallop, or edema. VASCULAR:  Normal Pulses. No bruits. RESPIRATORY:  Clear to auscultation without rales, wheezing or rhonchi  ABDOMEN: Soft, non-tender, non-distended, No pulsatile mass, MUSCULOSKELETAL: No deformity  SKIN: Warm and dry NEUROLOGIC:  Alert and oriented x 3 PSYCHIATRIC:  Normal affect   ASSESSMENT:    1. CAD in native artery   2. Essential hypertension   3. Other hyperlipidemia   4. Diastolic dysfunction    PLAN:    In order of problems listed above:  Secondary prevention discussed Blood pressure control is excellent.  Target 130/80 mmHg or less.  Weight reduction discussed.  DASH diet discussed. Continue atorvastatin 20 mg/day. Not short of breath.  No primary therapy as she is happy with her level of physical activity  Overall education and awareness concerning primary/secondary risk prevention was discussed in detail: LDL less than 70, hemoglobin A1c less than 7, blood pressure target less  than 130/80 mmHg, >150 minutes of moderate aerobic activity per week, avoidance of smoking, weight control (via diet and exercise), and continued surveillance/management of/for obstructive sleep apnea.   Primary cardiologist referral to Dr. 02/07/20.    Medication Adjustments/Labs and Tests Ordered: Current medicines are reviewed at length with the patient today.  Concerns regarding medicines are outlined above.  Orders Placed This Encounter  Procedures   EKG 12-Lead   No orders of the defined types were placed in this encounter.   Patient Instructions  Medication Instructions:  Your physician recommends that you continue on your current medications as directed. Please refer to the Current Medication  list given to you today.  *If you need a refill on your cardiac medications before your next appointment, please call your pharmacy*  Lab Work: NONE  Testing/Procedures: NONE  Follow-Up: At [facility], you and your health needs are our priority.  As part of our continuing mission to provide you with exceptional heart care, we have created designated Provider Care Teams.  These Care Teams include your primary Cardiologist (physician) and Advanced Practice Providers (APPs -  Physician Assistants and Nurse Practitioners) who all work together to provide you with the care you need, when you need it.  Your next appointment:   1 year(s)  The format for your next appointment:   In Person  Provider:   Lance Muss, MD  Important Information About Sugar         Signed, Lesleigh Noe, MD  05/31/2022 10:59 AM    [facility]

## 2022-05-31 ENCOUNTER — Encounter: Payer: Self-pay | Admitting: Interventional Cardiology

## 2022-05-31 ENCOUNTER — Ambulatory Visit: Payer: PPO | Admitting: Interventional Cardiology

## 2022-05-31 VITALS — BP 138/78 | HR 74 | Ht 64.0 in | Wt 165.4 lb

## 2022-05-31 DIAGNOSIS — I10 Essential (primary) hypertension: Secondary | ICD-10-CM

## 2022-05-31 DIAGNOSIS — E7849 Other hyperlipidemia: Secondary | ICD-10-CM

## 2022-05-31 DIAGNOSIS — I2510 Atherosclerotic heart disease of native coronary artery without angina pectoris: Secondary | ICD-10-CM

## 2022-05-31 DIAGNOSIS — I5189 Other ill-defined heart diseases: Secondary | ICD-10-CM

## 2022-05-31 NOTE — Patient Instructions (Signed)
Medication Instructions:  Your physician recommends that you continue on your current medications as directed. Please refer to the Current Medication list given to you today.  *If you need a refill on your cardiac medications before your next appointment, please call your pharmacy*  Lab Work: NONE  Testing/Procedures: NONE  Follow-Up: At [facility], you and your health needs are our priority.  As part of our continuing mission to provide you with exceptional heart care, we have created designated Provider Care Teams.  These Care Teams include your primary Cardiologist (physician) and Advanced Practice Providers (APPs -  Physician Assistants and Nurse Practitioners) who all work together to provide you with the care you need, when you need it.  Your next appointment:   1 year(s)  The format for your next appointment:   In Person  Provider:   Jayadeep Varanasi, MD  Important Information About Sugar       

## 2022-06-21 DIAGNOSIS — E782 Mixed hyperlipidemia: Secondary | ICD-10-CM | POA: Diagnosis not present

## 2022-06-21 DIAGNOSIS — L409 Psoriasis, unspecified: Secondary | ICD-10-CM | POA: Diagnosis not present

## 2022-06-21 DIAGNOSIS — I2510 Atherosclerotic heart disease of native coronary artery without angina pectoris: Secondary | ICD-10-CM | POA: Diagnosis not present

## 2022-06-21 DIAGNOSIS — Z0000 Encounter for general adult medical examination without abnormal findings: Secondary | ICD-10-CM | POA: Diagnosis not present

## 2022-06-21 DIAGNOSIS — Z87440 Personal history of urinary (tract) infections: Secondary | ICD-10-CM | POA: Diagnosis not present

## 2022-06-21 DIAGNOSIS — I119 Hypertensive heart disease without heart failure: Secondary | ICD-10-CM | POA: Diagnosis not present

## 2022-06-21 DIAGNOSIS — R7303 Prediabetes: Secondary | ICD-10-CM | POA: Diagnosis not present

## 2022-06-21 DIAGNOSIS — A6000 Herpesviral infection of urogenital system, unspecified: Secondary | ICD-10-CM | POA: Diagnosis not present

## 2022-06-21 DIAGNOSIS — Z1211 Encounter for screening for malignant neoplasm of colon: Secondary | ICD-10-CM | POA: Diagnosis not present

## 2022-06-21 DIAGNOSIS — F411 Generalized anxiety disorder: Secondary | ICD-10-CM | POA: Diagnosis not present

## 2022-06-21 DIAGNOSIS — R399 Unspecified symptoms and signs involving the genitourinary system: Secondary | ICD-10-CM | POA: Diagnosis not present

## 2022-06-21 DIAGNOSIS — K589 Irritable bowel syndrome without diarrhea: Secondary | ICD-10-CM | POA: Diagnosis not present

## 2022-06-22 ENCOUNTER — Other Ambulatory Visit: Payer: Self-pay | Admitting: Family Medicine

## 2022-06-22 DIAGNOSIS — Z1231 Encounter for screening mammogram for malignant neoplasm of breast: Secondary | ICD-10-CM

## 2022-06-22 DIAGNOSIS — Z13820 Encounter for screening for osteoporosis: Secondary | ICD-10-CM

## 2022-07-18 DIAGNOSIS — M9902 Segmental and somatic dysfunction of thoracic region: Secondary | ICD-10-CM | POA: Diagnosis not present

## 2022-07-18 DIAGNOSIS — M62830 Muscle spasm of back: Secondary | ICD-10-CM | POA: Diagnosis not present

## 2022-07-18 DIAGNOSIS — M9901 Segmental and somatic dysfunction of cervical region: Secondary | ICD-10-CM | POA: Diagnosis not present

## 2022-07-18 DIAGNOSIS — M542 Cervicalgia: Secondary | ICD-10-CM | POA: Diagnosis not present

## 2022-07-31 ENCOUNTER — Other Ambulatory Visit: Payer: Self-pay | Admitting: Interventional Cardiology

## 2022-07-31 NOTE — Telephone Encounter (Signed)
Rx refill sent to pharmacy. 

## 2022-09-27 ENCOUNTER — Ambulatory Visit: Payer: PPO

## 2022-10-15 DIAGNOSIS — M62830 Muscle spasm of back: Secondary | ICD-10-CM | POA: Diagnosis not present

## 2022-10-15 DIAGNOSIS — M542 Cervicalgia: Secondary | ICD-10-CM | POA: Diagnosis not present

## 2022-10-15 DIAGNOSIS — M9901 Segmental and somatic dysfunction of cervical region: Secondary | ICD-10-CM | POA: Diagnosis not present

## 2022-10-15 DIAGNOSIS — M9902 Segmental and somatic dysfunction of thoracic region: Secondary | ICD-10-CM | POA: Diagnosis not present

## 2022-10-17 DIAGNOSIS — M9901 Segmental and somatic dysfunction of cervical region: Secondary | ICD-10-CM | POA: Diagnosis not present

## 2022-10-17 DIAGNOSIS — M9902 Segmental and somatic dysfunction of thoracic region: Secondary | ICD-10-CM | POA: Diagnosis not present

## 2022-10-17 DIAGNOSIS — M62830 Muscle spasm of back: Secondary | ICD-10-CM | POA: Diagnosis not present

## 2022-10-17 DIAGNOSIS — M542 Cervicalgia: Secondary | ICD-10-CM | POA: Diagnosis not present

## 2022-10-22 DIAGNOSIS — M9901 Segmental and somatic dysfunction of cervical region: Secondary | ICD-10-CM | POA: Diagnosis not present

## 2022-10-22 DIAGNOSIS — M9902 Segmental and somatic dysfunction of thoracic region: Secondary | ICD-10-CM | POA: Diagnosis not present

## 2022-10-22 DIAGNOSIS — M62830 Muscle spasm of back: Secondary | ICD-10-CM | POA: Diagnosis not present

## 2022-10-22 DIAGNOSIS — M542 Cervicalgia: Secondary | ICD-10-CM | POA: Diagnosis not present

## 2022-10-24 DIAGNOSIS — M9902 Segmental and somatic dysfunction of thoracic region: Secondary | ICD-10-CM | POA: Diagnosis not present

## 2022-10-24 DIAGNOSIS — M542 Cervicalgia: Secondary | ICD-10-CM | POA: Diagnosis not present

## 2022-10-24 DIAGNOSIS — M62830 Muscle spasm of back: Secondary | ICD-10-CM | POA: Diagnosis not present

## 2022-10-24 DIAGNOSIS — M9901 Segmental and somatic dysfunction of cervical region: Secondary | ICD-10-CM | POA: Diagnosis not present

## 2022-10-29 DIAGNOSIS — M9901 Segmental and somatic dysfunction of cervical region: Secondary | ICD-10-CM | POA: Diagnosis not present

## 2022-10-29 DIAGNOSIS — M9902 Segmental and somatic dysfunction of thoracic region: Secondary | ICD-10-CM | POA: Diagnosis not present

## 2022-10-29 DIAGNOSIS — M542 Cervicalgia: Secondary | ICD-10-CM | POA: Diagnosis not present

## 2022-10-29 DIAGNOSIS — M62830 Muscle spasm of back: Secondary | ICD-10-CM | POA: Diagnosis not present

## 2022-10-31 ENCOUNTER — Ambulatory Visit: Payer: PPO

## 2022-10-31 DIAGNOSIS — M9902 Segmental and somatic dysfunction of thoracic region: Secondary | ICD-10-CM | POA: Diagnosis not present

## 2022-10-31 DIAGNOSIS — M9901 Segmental and somatic dysfunction of cervical region: Secondary | ICD-10-CM | POA: Diagnosis not present

## 2022-10-31 DIAGNOSIS — M542 Cervicalgia: Secondary | ICD-10-CM | POA: Diagnosis not present

## 2022-10-31 DIAGNOSIS — M62830 Muscle spasm of back: Secondary | ICD-10-CM | POA: Diagnosis not present

## 2022-12-10 ENCOUNTER — Other Ambulatory Visit: Payer: PPO

## 2022-12-12 DIAGNOSIS — M9901 Segmental and somatic dysfunction of cervical region: Secondary | ICD-10-CM | POA: Diagnosis not present

## 2022-12-12 DIAGNOSIS — M62830 Muscle spasm of back: Secondary | ICD-10-CM | POA: Diagnosis not present

## 2022-12-12 DIAGNOSIS — M9902 Segmental and somatic dysfunction of thoracic region: Secondary | ICD-10-CM | POA: Diagnosis not present

## 2022-12-12 DIAGNOSIS — M542 Cervicalgia: Secondary | ICD-10-CM | POA: Diagnosis not present

## 2022-12-19 ENCOUNTER — Ambulatory Visit
Admission: RE | Admit: 2022-12-19 | Discharge: 2022-12-19 | Disposition: A | Discharge status: Home / Self Care | Payer: PPO | Source: Ambulatory Visit | Attending: Family Medicine | Admitting: Family Medicine

## 2022-12-19 DIAGNOSIS — Z1231 Encounter for screening mammogram for malignant neoplasm of breast: Secondary | ICD-10-CM | POA: Diagnosis not present

## 2022-12-24 DIAGNOSIS — R32 Unspecified urinary incontinence: Secondary | ICD-10-CM | POA: Diagnosis not present

## 2022-12-24 DIAGNOSIS — Z1211 Encounter for screening for malignant neoplasm of colon: Secondary | ICD-10-CM | POA: Diagnosis not present

## 2022-12-24 DIAGNOSIS — I119 Hypertensive heart disease without heart failure: Secondary | ICD-10-CM | POA: Diagnosis not present

## 2022-12-24 DIAGNOSIS — I2510 Atherosclerotic heart disease of native coronary artery without angina pectoris: Secondary | ICD-10-CM | POA: Diagnosis not present

## 2022-12-24 DIAGNOSIS — E782 Mixed hyperlipidemia: Secondary | ICD-10-CM | POA: Diagnosis not present

## 2022-12-24 DIAGNOSIS — R7303 Prediabetes: Secondary | ICD-10-CM | POA: Diagnosis not present

## 2022-12-24 DIAGNOSIS — K649 Unspecified hemorrhoids: Secondary | ICD-10-CM | POA: Diagnosis not present

## 2023-01-02 DIAGNOSIS — L400 Psoriasis vulgaris: Secondary | ICD-10-CM | POA: Diagnosis not present

## 2023-01-10 ENCOUNTER — Encounter: Payer: Self-pay | Admitting: Family Medicine

## 2023-01-28 DIAGNOSIS — M542 Cervicalgia: Secondary | ICD-10-CM | POA: Diagnosis not present

## 2023-01-28 DIAGNOSIS — M9902 Segmental and somatic dysfunction of thoracic region: Secondary | ICD-10-CM | POA: Diagnosis not present

## 2023-01-28 DIAGNOSIS — M9901 Segmental and somatic dysfunction of cervical region: Secondary | ICD-10-CM | POA: Diagnosis not present

## 2023-01-28 DIAGNOSIS — M62830 Muscle spasm of back: Secondary | ICD-10-CM | POA: Diagnosis not present

## 2023-01-30 DIAGNOSIS — M62830 Muscle spasm of back: Secondary | ICD-10-CM | POA: Diagnosis not present

## 2023-01-30 DIAGNOSIS — M542 Cervicalgia: Secondary | ICD-10-CM | POA: Diagnosis not present

## 2023-01-30 DIAGNOSIS — M9901 Segmental and somatic dysfunction of cervical region: Secondary | ICD-10-CM | POA: Diagnosis not present

## 2023-01-30 DIAGNOSIS — M9902 Segmental and somatic dysfunction of thoracic region: Secondary | ICD-10-CM | POA: Diagnosis not present

## 2023-02-04 DIAGNOSIS — M9902 Segmental and somatic dysfunction of thoracic region: Secondary | ICD-10-CM | POA: Diagnosis not present

## 2023-02-04 DIAGNOSIS — M62830 Muscle spasm of back: Secondary | ICD-10-CM | POA: Diagnosis not present

## 2023-02-04 DIAGNOSIS — M9901 Segmental and somatic dysfunction of cervical region: Secondary | ICD-10-CM | POA: Diagnosis not present

## 2023-02-04 DIAGNOSIS — M542 Cervicalgia: Secondary | ICD-10-CM | POA: Diagnosis not present

## 2023-02-05 ENCOUNTER — Ambulatory Visit: Payer: PPO | Admitting: Physical Therapy

## 2023-02-06 ENCOUNTER — Encounter: Payer: Self-pay | Admitting: Physical Therapy

## 2023-02-07 ENCOUNTER — Ambulatory Visit: Payer: PPO

## 2023-02-14 ENCOUNTER — Ambulatory Visit: Payer: PPO

## 2023-02-26 ENCOUNTER — Ambulatory Visit: Payer: Self-pay

## 2023-03-06 ENCOUNTER — Other Ambulatory Visit: Payer: Self-pay

## 2023-03-06 ENCOUNTER — Ambulatory Visit: Payer: PPO | Attending: Family Medicine

## 2023-03-06 DIAGNOSIS — M6281 Muscle weakness (generalized): Secondary | ICD-10-CM | POA: Insufficient documentation

## 2023-03-06 DIAGNOSIS — R279 Unspecified lack of coordination: Secondary | ICD-10-CM | POA: Diagnosis not present

## 2023-03-06 DIAGNOSIS — N393 Stress incontinence (female) (male): Secondary | ICD-10-CM | POA: Diagnosis not present

## 2023-03-06 DIAGNOSIS — R293 Abnormal posture: Secondary | ICD-10-CM | POA: Insufficient documentation

## 2023-03-06 NOTE — Patient Instructions (Signed)
The knack: Use this technique while coughing, laughing, sneezing, or with any activities that causes you to leak urine a little. Right before you perform one of these activities that increase pressure in the abdomen and pushes a little urine out, perform a pelvic floor muscle contraction and hold. If that does not completely stop the leaking, try tightening your thighs together in addition to performing a pelvic floor muscle contraction. Make sure you are not trying to stifle a cough, sneeze, or laugh; allow these activities in full as it will cause less pressure down into the bladder and pelvic floor muscles.   [facility] [address], [address] [place], [place] [address] Phone # [phone] Fax [phone]  

## 2023-03-06 NOTE — Therapy (Signed)
OUTPATIENT PHYSICAL THERAPY FEMALE PELVIC EVALUATION   Patient Name: Maria Lamb MRN: [id number] DOB:12-Oct-1951, 72 y.o., female Today's Date: 03/06/2023  END OF SESSION:  PT End of Session - 03/06/23 1232     Visit Number 1    Date for PT Re-Evaluation 05/15/23    Authorization Type HTA    PT Start Time 1230    PT Stop Time 1310    PT Time Calculation (min) 40 min    Activity Tolerance Patient tolerated treatment well    Behavior During Therapy [facility] for tasks assessed/performed             Past Medical History:  Diagnosis Date   Anxiety    HLD (hyperlipidemia) 05/29/2017   HSV infection    Hypertension    Significant white coat syndrome   NSTEMI (non-ST elevated myocardial infarction) ([facility])    NSVT (nonsustained ventricular tachycardia) ([facility]) 05/29/2017   Old non-ST elevation MI July 2018    Psoriasis    S/P angioplasty with stent, LCX 05/28/17 with DES 05/29/2017   Past Surgical History:  Procedure Laterality Date   CORONARY STENT INTERVENTION N/A 05/28/2017   Procedure: Coronary Stent Intervention;  Surgeon: Swaziland, Peter M, MD;  Location: [facility] INVASIVE [facility];  Service: Cardiovascular;  Laterality: N/A;   LEFT HEART CATH AND CORONARY ANGIOGRAPHY N/A 05/28/2017   Procedure: Left Heart Cath and Coronary Angiography;  Surgeon: Swaziland, Peter M, MD;  Location: [facility] INVASIVE CV LAB;  Service: Cardiovascular;  Laterality: N/A;   TONSILLECTOMY AND ADENOIDECTOMY     Patient Active Problem List   Diagnosis Date Noted   Essential hypertension 05/09/2018   S/P angioplasty with stent, LCX 05/28/17 with DES 05/29/2017   HLD (hyperlipidemia) 05/29/2017   NSVT (nonsustained ventricular tachycardia) 05/29/2017   Old non-ST elevation MI July 2018     PCP: Lupita Raider, MD   REFERRING PROVIDER: Lupita Raider, MD   REFERRING DIAG: R32 (ICD-10-CM) - Urinary leakage  THERAPY DIAG:  Urinary, incontinence, stress female  Stress incontinence (female) (female)  Muscle weakness  (generalized)  Abnormal posture  Unspecified lack of coordination  Rationale for Evaluation and Treatment: Rehabilitation  ONSET DATE: several years   SUBJECTIVE:                                                                                                                                                                                           EVAL SUBJECTIVE STATEMENT: Pt states that she has been having difficulty with urinary leakage and the start of a prolapse. She feels like she has been having difficulty since having children, but it wasn't as bothersome. She can  now feel something in the vagina, but it has recently gotten better. She has been doing kegels. She sees chiropractor for occasional Rt low back/hip support.  Fluid intake: Yes: "not enough" working on drinking more    PAIN:  Are you having pain? No   PRECAUTIONS: None  WEIGHT BEARING RESTRICTIONS: No  FALLS:  Has patient fallen in last 6 months? No  LIVING ENVIRONMENT: Lives with: lives with their spouse Lives in: House/apartment  OCCUPATION: retired   PLOF: Independent  PATIENT GOALS: unsure - she told MD she feels like she's coming apart  PERTINENT HISTORY:  G2P2 Sexual abuse: No  BOWEL MOVEMENT: Pain with bowel movement: No Type of bowel movement:Frequency 1x/day and Strain No  Fully empty rectum: No - uncertain Leakage: No Pads: Yes: - Fiber supplement: No  URINATION: Pain with urination: No Fully empty bladder: Yes: - Stream: Strong Urgency: No Frequency: 4x/day, 1x/night some nights  Leakage: Walking to the bathroom and Coughing Pads: Yes: panty liner daily  INTERCOURSE: Pain with intercourse: Initial Penetration and During Penetration Ability to have vaginal penetration:  Yes: - Climax: yes   PREGNANCY: Vaginal deliveries 2 Tearing Yes: episiotomy   PROLAPSE: Sensation of vaginal bulge    OBJECTIVE:  03/06/23:  COGNITION: Overall cognitive status: Within functional  limits for tasks assessed     SENSATION: Light touch: Appears intact Proprioception: Appears intact  MUSCLE LENGTH:   FUNCTIONAL TESTS: breath holding and increased abdominal pressure/bearing down with sit up   GAIT: Comments: WNL  POSTURE: rounded shoulders, forward head, increased thoracic kyphosis, and Lt lumbar rotation  PALPATION:   General  Tenderness in Lt lower quadrant                External Perineal Exam Lt labia minor fusion; moderate dryness                             Internal Pelvic Floor No tenderness or restriction throughout superficial/deep muscle layers   Patient confirms identification and approves PT to assess internal pelvic floor and treatment Yes  PELVIC MMT:   MMT eval  Vaginal 3-/5 strength, 4 second endurance, 6 repeat contractions in 10 seconds  Internal Anal Sphincter   External Anal Sphincter   Puborectalis   Diastasis Recti 1 finger width separation with distortion above umbilicus  (Blank rows = not tested)        TONE: low  PROLAPSE: Grade 2 anterior vaginal wall laxity   TODAY'S TREATMENT:                                                                                                                              DATE:  03/06/23  EVAL  Neuromuscular re-education: Pelvic floor contraction training Quick flicks Long holds Therapeutic activities: The knack    PATIENT EDUCATION:  Education details: See above Person educated: Patient Education method: Explanation, Demonstration, Tactile cues, Verbal cues, and  Handouts Education comprehension: verbalized understanding  HOME EXERCISE PROGRAM: [id number]  ASSESSMENT:  CLINICAL IMPRESSION: Patient is a 72 y.o. female who was seen today for physical therapy evaluation and treatment for prolapse symptoms and stress/urge incontinence. Exam findings notable for abnormal posture, poor pressure management, core weakness, pelvic floor weakness, decreased pelvic floor endurance,  decreased repeat contractions, and anterior vaginal wall laxity. Signs and symptoms are most consistent with pelvic floor weakness and poor pressure management. Initial treatment included education on pressure management, the knack, and pelvic floor contractions training. She will continue to benefit from skilled PT intervention in order to decrease incontinence, improve sensation of vaginal bulge, and correct pressure management to prevent prolapse symptoms from worsening.    OBJECTIVE IMPAIRMENTS: decreased activity tolerance, decreased coordination, decreased endurance, decreased strength, increased fascial restrictions, increased muscle spasms, impaired tone, postural dysfunction, and pain.   ACTIVITY LIMITATIONS: continence  PARTICIPATION LIMITATIONS: community activity  PERSONAL FACTORS: 1 comorbidity: G2P2  are also affecting patient's functional outcome.   REHAB POTENTIAL: Good  CLINICAL DECISION MAKING: Stable/uncomplicated  EVALUATION COMPLEXITY: Low   GOALS: Goals reviewed with patient? Yes  SHORT TERM GOALS: Target date: 04/17/23  Pt will be independent with HEP.   Baseline: Goal status: INITIAL  2.  Pt will be independent with the knack, urge suppression technique, and double voiding in order to improve bladder habits and decrease urinary incontinence.   Baseline:  Goal status: INITIAL  3.  Pt will be able to correctly perform diaphragmatic breathing and appropriate pressure management in order to prevent worsening vaginal wall laxity and improve pelvic floor A/ROM.   Baseline:  Goal status: INITIAL   LONG TERM GOALS: Target date: 05/15/2023  Pt will be independent with advanced HEP.   Baseline:  Goal status: INITIAL  2.  Pt will demonstrate normal pelvic floor muscle tone and A/ROM, able to achieve 4/5 strength with contractions and 10 sec endurance, in order to provide appropriate lumbopelvic support in functional activities.   Baseline:  Goal status:  INITIAL  3.  Pt will report no leaks with laughing, coughing, sneezing in order to improve comfort with interpersonal relationships and community activities.   Baseline:  Goal status: INITIAL  4.  Pt will see improvement in vaginal wall laxity from Grade 2 to Grade 1 and not have any bothersome symptoms. Baseline:  Goal status: INITIAL   PLAN:  PT FREQUENCY: 1-2x/week  PT DURATION: 10 weeks  PLANNED INTERVENTIONS: Therapeutic exercises, Therapeutic activity, Neuromuscular re-education, Balance training, Gait training, Patient/Family education, Self Care, Joint mobilization, Dry Needling, Biofeedback, and Manual therapy  PLAN FOR NEXT SESSION: Plan to perform pelvic floor contraction training in standing; core training.    Julio Alm, PT, DPT04/24/241:26 PM

## 2023-03-18 ENCOUNTER — Ambulatory Visit: Payer: PPO | Admitting: Physical Therapy

## 2023-03-21 ENCOUNTER — Ambulatory Visit: Payer: PPO | Attending: Family Medicine

## 2023-03-21 DIAGNOSIS — M6281 Muscle weakness (generalized): Secondary | ICD-10-CM | POA: Insufficient documentation

## 2023-03-21 DIAGNOSIS — N393 Stress incontinence (female) (male): Secondary | ICD-10-CM

## 2023-03-21 DIAGNOSIS — R279 Unspecified lack of coordination: Secondary | ICD-10-CM | POA: Insufficient documentation

## 2023-03-21 DIAGNOSIS — R293 Abnormal posture: Secondary | ICD-10-CM | POA: Diagnosis not present

## 2023-03-21 NOTE — Therapy (Signed)
OUTPATIENT PHYSICAL THERAPY TREATMENT NOTE   Patient Name: Maria Lamb MRN: [id number] DOB:Dec 07, 1950, 72 y.o., female Today's Date: 03/21/2023  PCP: Lupita Raider, MD  REFERRING PROVIDER: Lupita Raider, MD  END OF SESSION:   PT End of Session - 03/21/23 1358     Visit Number 2    Date for PT Re-Evaluation 05/15/23    Authorization Type HTA    PT Start Time 1400    PT Stop Time 1440    PT Time Calculation (min) 40 min    Activity Tolerance Patient tolerated treatment well    Behavior During Therapy [facility] for tasks assessed/performed             Past Medical History:  Diagnosis Date   Anxiety    HLD (hyperlipidemia) 05/29/2017   HSV infection    Hypertension    Significant white coat syndrome   NSTEMI (non-ST elevated myocardial infarction) (HCC)    NSVT (nonsustained ventricular tachycardia) (HCC) 05/29/2017   Old non-ST elevation MI July 2018    Psoriasis    S/P angioplasty with stent, LCX 05/28/17 with DES 05/29/2017   Past Surgical History:  Procedure Laterality Date   CORONARY STENT INTERVENTION N/A 05/28/2017   Procedure: Coronary Stent Intervention;  Surgeon: Swaziland, Peter M, MD;  Location: [facility] INVASIVE CV LAB;  Service: Cardiovascular;  Laterality: N/A;   LEFT HEART CATH AND CORONARY ANGIOGRAPHY N/A 05/28/2017   Procedure: Left Heart Cath and Coronary Angiography;  Surgeon: Swaziland, Peter M, MD;  Location: [facility] INVASIVE CV LAB;  Service: Cardiovascular;  Laterality: N/A;   TONSILLECTOMY AND ADENOIDECTOMY     Patient Active Problem List   Diagnosis Date Noted   Essential hypertension 05/09/2018   S/P angioplasty with stent, LCX 05/28/17 with DES 05/29/2017   HLD (hyperlipidemia) 05/29/2017   NSVT (nonsustained ventricular tachycardia) (HCC) 05/29/2017   Old non-ST elevation MI July 2018     REFERRING DIAG: R32 (ICD-10-CM) - Urinary leakage  THERAPY DIAG:  Urinary, incontinence, stress female  Stress incontinence (female) (female)  Muscle weakness  (generalized)  Abnormal posture  Unspecified lack of coordination  Rationale for Evaluation and Treatment Rehabilitation  PERTINENT HISTORY: G2P2  PRECAUTIONS: NA  SUBJECTIVE:                                                                                                                                                                                      SUBJECTIVE STATEMENT:  Pt has had a death in the family and has not been very focused on her pelvic floor PT this last week. She was working on contractions regularly up until this point and states that it was much stronger. She  reports that she has leaked, but it does seem better.    PAIN:  Are you having pain? No   EVAL SUBJECTIVE STATEMENT: Pt states that she has been having difficulty with urinary leakage and the start of a prolapse. She feels like she has been having difficulty since having children, but it wasn't as bothersome. She can now feel something in the vagina, but it has recently gotten better. She has been doing kegels. She sees chiropractor for occasional Rt low back/hip support.  Fluid intake: Yes: "not enough" working on drinking more   PAIN:  Are you having pain? No   PRECAUTIONS: None  WEIGHT BEARING RESTRICTIONS: No  FALLS:  Has patient fallen in last 6 months? No  LIVING ENVIRONMENT: Lives with: lives with their spouse Lives in: House/apartment  OCCUPATION: retired   PLOF: Independent  PATIENT GOALS: unsure - she told MD she feels like she's coming apart  PERTINENT HISTORY:  G2P2 Sexual abuse: No  BOWEL MOVEMENT: Pain with bowel movement: No Type of bowel movement:Frequency 1x/day and Strain No  Fully empty rectum: No - uncertain Leakage: No Pads: Yes: - Fiber supplement: No  URINATION: Pain with urination: No Fully empty bladder: Yes: - Stream: Strong Urgency: No Frequency: 4x/day, 1x/night some nights  Leakage: Walking to the bathroom and Coughing Pads: Yes: panty liner  daily  INTERCOURSE: Pain with intercourse: Initial Penetration and During Penetration Ability to have vaginal penetration:  Yes: - Climax: yes   PREGNANCY: Vaginal deliveries 2 Tearing Yes: episiotomy   PROLAPSE: Sensation of vaginal bulge    OBJECTIVE:  03/06/23:  COGNITION: Overall cognitive status: Within functional limits for tasks assessed     SENSATION: Light touch: Appears intact Proprioception: Appears intact  MUSCLE LENGTH:   FUNCTIONAL TESTS: breath holding and increased abdominal pressure/bearing down with sit up   GAIT: Comments: WNL  POSTURE: rounded shoulders, forward head, increased thoracic kyphosis, and Lt lumbar rotation  PALPATION:   General  Tenderness in Lt lower quadrant                External Perineal Exam Lt labia minor fusion; moderate dryness                             Internal Pelvic Floor No tenderness or restriction throughout superficial/deep muscle layers   Patient confirms identification and approves PT to assess internal pelvic floor and treatment Yes  PELVIC MMT:   MMT eval  Vaginal 3-/5 strength, 4 second endurance, 6 repeat contractions in 10 seconds  Internal Anal Sphincter   External Anal Sphincter   Puborectalis   Diastasis Recti 1 finger width separation with distortion above umbilicus  (Blank rows = not tested)        TONE: low  PROLAPSE: Grade 2 anterior vaginal wall laxity   TODAY'S TREATMENT:  DATE:  03/21/23 Neuromuscular re-education: Pelvic floor contraction training in standing Transversus abdominus training with multimodal cues for improved motor control and breath coordination Supine UE ball press with core/pelvic floor 10x Supine hip adduction with core/pelvic floor 10x Side lying UE ball press with core/pelvic floor activation 10x bil Therapeutic activities: Pressure  management  Pelvic floor/deep core anatomy with poster demonstration   03/06/23  EVAL  Neuromuscular re-education: Pelvic floor contraction training Quick flicks Long holds Therapeutic activities: The knack    PATIENT EDUCATION:  Education details: See above Person educated: Patient Education method: Explanation, Demonstration, Tactile cues, Verbal cues, and Handouts Education comprehension: verbalized understanding  HOME EXERCISE PROGRAM: [id number]  ASSESSMENT:  CLINICAL IMPRESSION: Pt is starting to see some progress with urinary leaking over the last several weeks. She did well with standing progressions of pelvic floor contractions. She was also able to begin core training and progressions into isometrics with breath coordination. She responded very well to minimal multimodal cues to improve core/pelvic floor activation in other exercise progressions. Visual aides utilized in discussion of pelvic floor/deep core anatomy to help her understand pressure management and breathing. She will continue to benefit from skilled PT intervention in order to decrease incontinence, improve sensation of vaginal bulge, and correct pressure management to prevent prolapse symptoms from worsening.    OBJECTIVE IMPAIRMENTS: decreased activity tolerance, decreased coordination, decreased endurance, decreased strength, increased fascial restrictions, increased muscle spasms, impaired tone, postural dysfunction, and pain.   ACTIVITY LIMITATIONS: continence  PARTICIPATION LIMITATIONS: community activity  PERSONAL FACTORS: 1 comorbidity: G2P2 are also affecting patient's functional outcome.   REHAB POTENTIAL: Good  CLINICAL DECISION MAKING: Stable/uncomplicated  EVALUATION COMPLEXITY: Low   GOALS: Goals reviewed with patient? Yes  SHORT TERM GOALS: Target date: 04/17/23  Pt will be independent with HEP.   Baseline: Goal status: INITIAL  2.  Pt will be independent with the knack, urge  suppression technique, and double voiding in order to improve bladder habits and decrease urinary incontinence.   Baseline:  Goal status: INITIAL  3.  Pt will be able to correctly perform diaphragmatic breathing and appropriate pressure management in order to prevent worsening vaginal wall laxity and improve pelvic floor A/ROM.   Baseline:  Goal status: INITIAL   LONG TERM GOALS: Target date: 05/15/2023  Pt will be independent with advanced HEP.   Baseline:  Goal status: INITIAL  2.  Pt will demonstrate normal pelvic floor muscle tone and A/ROM, able to achieve 4/5 strength with contractions and 10 sec endurance, in order to provide appropriate lumbopelvic support in functional activities.   Baseline:  Goal status: INITIAL  3.  Pt will report no leaks with laughing, coughing, sneezing in order to improve comfort with interpersonal relationships and community activities.   Baseline:  Goal status: INITIAL  4.  Pt will see improvement in vaginal wall laxity from Grade 2 to Grade 1 and not have any bothersome symptoms. Baseline:  Goal status: INITIAL   PLAN:  PT FREQUENCY: 1-2x/week  PT DURATION: 10 weeks  PLANNED INTERVENTIONS: Therapeutic exercises, Therapeutic activity, Neuromuscular re-education, Balance training, Gait training, Patient/Family education, Self Care, Joint mobilization, Dry Needling, Biofeedback, and Manual therapy  PLAN FOR NEXT SESSION: Possibly perform internal pelvic floor exam to assess strength of contraction; urge drill; progress core exercises.    Julio Alm, PT, DPT05/09/242:41 PM

## 2023-03-25 DIAGNOSIS — M9901 Segmental and somatic dysfunction of cervical region: Secondary | ICD-10-CM | POA: Diagnosis not present

## 2023-03-25 DIAGNOSIS — M542 Cervicalgia: Secondary | ICD-10-CM | POA: Diagnosis not present

## 2023-03-25 DIAGNOSIS — M9902 Segmental and somatic dysfunction of thoracic region: Secondary | ICD-10-CM | POA: Diagnosis not present

## 2023-03-25 DIAGNOSIS — M62830 Muscle spasm of back: Secondary | ICD-10-CM | POA: Diagnosis not present

## 2023-03-27 DIAGNOSIS — M62830 Muscle spasm of back: Secondary | ICD-10-CM | POA: Diagnosis not present

## 2023-03-27 DIAGNOSIS — M542 Cervicalgia: Secondary | ICD-10-CM | POA: Diagnosis not present

## 2023-03-27 DIAGNOSIS — M9901 Segmental and somatic dysfunction of cervical region: Secondary | ICD-10-CM | POA: Diagnosis not present

## 2023-03-27 DIAGNOSIS — M9902 Segmental and somatic dysfunction of thoracic region: Secondary | ICD-10-CM | POA: Diagnosis not present

## 2023-04-04 ENCOUNTER — Ambulatory Visit: Payer: PPO

## 2023-04-04 DIAGNOSIS — R279 Unspecified lack of coordination: Secondary | ICD-10-CM

## 2023-04-04 DIAGNOSIS — M6281 Muscle weakness (generalized): Secondary | ICD-10-CM

## 2023-04-04 DIAGNOSIS — N393 Stress incontinence (female) (male): Secondary | ICD-10-CM | POA: Diagnosis not present

## 2023-04-04 DIAGNOSIS — R293 Abnormal posture: Secondary | ICD-10-CM

## 2023-04-04 NOTE — Therapy (Signed)
OUTPATIENT PHYSICAL THERAPY TREATMENT NOTE   Patient Name: Maria Lamb MRN: [id number] DOB:Jun 10, 1951, 72 y.o., female Today's Date: 04/04/2023  PCP: Lupita Raider, MD  REFERRING PROVIDER: Lupita Raider, MD  END OF SESSION:   PT End of Session - 04/04/23 1446     Visit Number 3    Date for PT Re-Evaluation 05/15/23    Authorization Type HTA    PT Start Time 1445    PT Stop Time 1525    PT Time Calculation (min) 40 min    Activity Tolerance Patient tolerated treatment well    Behavior During Therapy [facility] for tasks assessed/performed              Past Medical History:  Diagnosis Date   Anxiety    HLD (hyperlipidemia) 05/29/2017   HSV infection    Hypertension    Significant white coat syndrome   NSTEMI (non-ST elevated myocardial infarction) (HCC)    NSVT (nonsustained ventricular tachycardia) (HCC) 05/29/2017   Old non-ST elevation MI July 2018    Psoriasis    S/P angioplasty with stent, LCX 05/28/17 with DES 05/29/2017   Past Surgical History:  Procedure Laterality Date   CORONARY STENT INTERVENTION N/A 05/28/2017   Procedure: Coronary Stent Intervention;  Surgeon: Swaziland, Peter M, MD;  Location: [facility] INVASIVE CV LAB;  Service: Cardiovascular;  Laterality: N/A;   LEFT HEART CATH AND CORONARY ANGIOGRAPHY N/A 05/28/2017   Procedure: Left Heart Cath and Coronary Angiography;  Surgeon: Swazil[place], Peter M, MD;  Location: [facility] INVASIVE CV LAB;  Service: Cardiovascular;  Laterality: N/A;   TONSILLECTOMY AND ADENOIDECTOMY     Patient Active Problem List   Diagnosis Date Noted   Essential hypertension 05/09/2018   S/P angioplasty with stent, LCX 05/28/17 with DES 05/29/2017   HLD (hyperlipidemia) 05/29/2017   NSVT (nonsustained ventricular tachycardia) (HCC) 05/29/2017   Old non-ST elevation MI July 2018     REFERRING DIAG: R32 (ICD-10-CM) - Urinary leakage  THERAPY DIAG:  Urinary, incontinence, stress female  Stress incontinence (female) (female)  Muscle weakness  (generalized)  Abnormal posture  Unspecified lack of coordination  Rationale for Evaluation and Treatment Rehabilitation  PERTINENT HISTORY: G2P2  PRECAUTIONS: NA  SUBJECTIVE:                                                                                                                                                                                      SUBJECTIVE STATEMENT:  Pt states that she thinks bladder is improving and now that she is in the rhythm of breathing out with effort, she is doing better with exercises. She is feeling less vaginal bulge. She is still having small amount  of leaking and is wearing a very thin liner.    PAIN:  Are you having pain? No   EVAL SUBJECTIVE STATEMENT: Pt states that she has been having difficulty with urinary leakage and the start of a prolapse. She feels like she has been having difficulty since having children, but it wasn't as bothersome. She can now feel something in the vagina, but it has recently gotten better. She has been doing kegels. She sees chiropractor for occasional Rt low back/hip support.  Fluid intake: Yes: "not enough" working on drinking more   PAIN:  Are you having pain? No   PRECAUTIONS: None  WEIGHT BEARING RESTRICTIONS: No  FALLS:  Has patient fallen in last 6 months? No  LIVING ENVIRONMENT: Lives with: lives with their spouse Lives in: House/apartment  OCCUPATION: retired   PLOF: Independent  PATIENT GOALS: unsure - she told MD she feels like she's coming apart  PERTINENT HISTORY:  G2P2 Sexual abuse: No  BOWEL MOVEMENT: Pain with bowel movement: No Type of bowel movement:Frequency 1x/day and Strain No  Fully empty rectum: No - uncertain Leakage: No Pads: Yes: - Fiber supplement: No  URINATION: Pain with urination: No Fully empty bladder: Yes: - Stream: Strong Urgency: No Frequency: 4x/day, 1x/night some nights  Leakage: Walking to the bathroom and Coughing Pads: Yes: panty liner  daily  INTERCOURSE: Pain with intercourse: Initial Penetration and During Penetration Ability to have vaginal penetration:  Yes: - Climax: yes   PREGNANCY: Vaginal deliveries 2 Tearing Yes: episiotomy   PROLAPSE: Sensation of vaginal bulge    OBJECTIVE:  03/06/23:  COGNITION: Overall cognitive status: Within functional limits for tasks assessed     SENSATION: Light touch: Appears intact Proprioception: Appears intact  MUSCLE LENGTH:   FUNCTIONAL TESTS: breath holding and increased abdominal pressure/bearing down with sit up   GAIT: Comments: WNL  POSTURE: rounded shoulders, forward head, increased thoracic kyphosis, and Lt lumbar rotation  PALPATION:   General  Tenderness in Lt lower quadrant                External Perineal Exam Lt labia minor fusion; moderate dryness                             Internal Pelvic Floor No tenderness or restriction throughout superficial/deep muscle layers   Patient confirms identification and approves PT to assess internal pelvic floor and treatment Yes  PELVIC MMT:   MMT eval  Vaginal 3-/5 strength, 4 second endurance, 6 repeat contractions in 10 seconds  Internal Anal Sphincter   External Anal Sphincter   Puborectalis   Diastasis Recti 1 finger width separation with distortion above umbilicus  (Blank rows = not tested)        TONE: low  PROLAPSE: Grade 2 anterior vaginal wall laxity   TODAY'S TREATMENT:  DATE:  04/04/23 Manual:  Neuromuscular re-education: Bridge with hip adduction/pelvic floor/core 2 x 10 Bridge with march 10x bil Supine LE extensions 10x bil Dead bug 2 x 10 Seated D2 10x bil red band Seated march with anterior weight hold 2 x 10 Exercises: Happy baby 10 breaths Lower trunk rotation 2 x 10    03/21/23 Neuromuscular re-education: Pelvic floor contraction training  in standing Transversus abdominus training with multimodal cues for improved motor control and breath coordination Supine UE ball press with core/pelvic floor 10x Supine hip adduction with core/pelvic floor 10x Side lying UE ball press with core/pelvic floor activation 10x bil Therapeutic activities: Pressure management  Pelvic floor/deep core anatomy with poster demonstration   03/06/23  EVAL  Neuromuscular re-education: Pelvic floor contraction training Quick flicks Long holds Therapeutic activities: The knack    PATIENT EDUCATION:  Education details: See above Person educated: Patient Education method: Explanation, Demonstration, Tactile cues, Verbal cues, and Handouts Education comprehension: verbalized understanding  HOME EXERCISE PROGRAM: [id number]  ASSESSMENT:  CLINICAL IMPRESSION: Pt doing very well with less noticeable vaginal bulge over the last week; urinary leaking is also improving. She did very well with core progressions and demonstrated excellent core activation/stability with challenging activities. She will continue to benefit from skilled PT intervention in order to decrease incontinence, improve sensation of vaginal bulge, and correct pressure management to prevent prolapse symptoms from worsening.    OBJECTIVE IMPAIRMENTS: decreased activity tolerance, decreased coordination, decreased endurance, decreased strength, increased fascial restrictions, increased muscle spasms, impaired tone, postural dysfunction, and pain.   ACTIVITY LIMITATIONS: continence  PARTICIPATION LIMITATIONS: community activity  PERSONAL FACTORS: 1 comorbidity: G2P2 are also affecting patient's functional outcome.   REHAB POTENTIAL: Good  CLINICAL DECISION MAKING: Stable/uncomplicated  EVALUATION COMPLEXITY: Low   GOALS: Goals reviewed with patient? Yes  SHORT TERM GOALS: Target date: 04/17/23  Pt will be independent with HEP.   Baseline: Goal status: INITIAL  2.  Pt  will be independent with the knack, urge suppression technique, and double voiding in order to improve bladder habits and decrease urinary incontinence.   Baseline:  Goal status: INITIAL  3.  Pt will be able to correctly perform diaphragmatic breathing and appropriate pressure management in order to prevent worsening vaginal wall laxity and improve pelvic floor A/ROM.   Baseline:  Goal status: INITIAL   LONG TERM GOALS: Target date: 05/15/2023  Pt will be independent with advanced HEP.   Baseline:  Goal status: INITIAL  2.  Pt will demonstrate normal pelvic floor muscle tone and A/ROM, able to achieve 4/5 strength with contractions and 10 sec endurance, in order to provide appropriate lumbopelvic support in functional activities.   Baseline:  Goal status: INITIAL  3.  Pt will report no leaks with laughing, coughing, sneezing in order to improve comfort with interpersonal relationships and community activities.   Baseline:  Goal status: INITIAL  4.  Pt will see improvement in vaginal wall laxity from Grade 2 to Grade 1 and not have any bothersome symptoms. Baseline:  Goal status: INITIAL   PLAN:  PT FREQUENCY: 1-2x/week  PT DURATION: 10 weeks  PLANNED INTERVENTIONS: Therapeutic exercises, Therapeutic activity, Neuromuscular re-education, Balance training, Gait training, Patient/Family education, Self Care, Joint mobilization, Dry Needling, Biofeedback, and Manual therapy  PLAN FOR NEXT SESSION: Continue to progress core/hip strengthening activities in seated and standing positions (4 more appointments)   Julio Alm, PT, DPT05/23/243:42 PM

## 2023-04-09 DIAGNOSIS — M62830 Muscle spasm of back: Secondary | ICD-10-CM | POA: Diagnosis not present

## 2023-04-09 DIAGNOSIS — M9902 Segmental and somatic dysfunction of thoracic region: Secondary | ICD-10-CM | POA: Diagnosis not present

## 2023-04-09 DIAGNOSIS — M542 Cervicalgia: Secondary | ICD-10-CM | POA: Diagnosis not present

## 2023-04-09 DIAGNOSIS — M9901 Segmental and somatic dysfunction of cervical region: Secondary | ICD-10-CM | POA: Diagnosis not present

## 2023-04-17 DIAGNOSIS — M62830 Muscle spasm of back: Secondary | ICD-10-CM | POA: Diagnosis not present

## 2023-04-17 DIAGNOSIS — M9901 Segmental and somatic dysfunction of cervical region: Secondary | ICD-10-CM | POA: Diagnosis not present

## 2023-04-17 DIAGNOSIS — M542 Cervicalgia: Secondary | ICD-10-CM | POA: Diagnosis not present

## 2023-04-17 DIAGNOSIS — M9902 Segmental and somatic dysfunction of thoracic region: Secondary | ICD-10-CM | POA: Diagnosis not present

## 2023-04-24 ENCOUNTER — Ambulatory Visit: Payer: PPO

## 2023-05-02 ENCOUNTER — Ambulatory Visit: Payer: PPO | Attending: Family Medicine

## 2023-05-02 DIAGNOSIS — M6281 Muscle weakness (generalized): Secondary | ICD-10-CM | POA: Insufficient documentation

## 2023-05-02 DIAGNOSIS — R279 Unspecified lack of coordination: Secondary | ICD-10-CM | POA: Insufficient documentation

## 2023-05-02 DIAGNOSIS — R293 Abnormal posture: Secondary | ICD-10-CM | POA: Insufficient documentation

## 2023-05-02 DIAGNOSIS — N393 Stress incontinence (female) (male): Secondary | ICD-10-CM | POA: Diagnosis not present

## 2023-05-02 NOTE — Therapy (Signed)
OUTPATIENT PHYSICAL THERAPY TREATMENT NOTE   Patient Name: Maria Lamb MRN: [id number] DOB:23-Apr-1951, 72 y.o., female Today's Date: 05/02/2023  PCP: Lupita Raider, MD  REFERRING PROVIDER: Lupita Raider, MD  END OF SESSION:   PT End of Session - 05/02/23 1402     Visit Number 4    Date for PT Re-Evaluation 05/15/23    Authorization Type HTA    PT Start Time 1400    PT Stop Time 1440    PT Time Calculation (min) 40 min    Activity Tolerance Patient tolerated treatment well    Behavior During Therapy [facility] for tasks assessed/performed               Past Medical History:  Diagnosis Date   Anxiety    HLD (hyperlipidemia) 05/29/2017   HSV infection    Hypertension    Significant white coat syndrome   NSTEMI (non-ST elevated myocardial infarction) (HCC)    NSVT (nonsustained ventricular tachycardia) (HCC) 05/29/2017   Old non-ST elevation MI July 2018    Psoriasis    S/P angioplasty with stent, LCX 05/28/17 with DES 05/29/2017   Past Surgical History:  Procedure Laterality Date   CORONARY STENT INTERVENTION N/A 05/28/2017   Procedure: Coronary Stent Intervention;  Surgeon: Swaziland, Peter M, MD;  Location: [facility] INVASIVE CV LAB;  Service: Cardiovascular;  Laterality: N/A;   LEFT HEART CATH AND CORONARY ANGIOGRAPHY N/A 05/28/2017   Procedure: Left Heart Cath and Coronary Angiography;  Surgeon: Swaziland, Peter M, MD;  Location: [facility] INVASIVE CV LAB;  Service: Cardiovascular;  Laterality: N/A;   TONSILLECTOMY AND ADENOIDECTOMY     Patient Active Problem List   Diagnosis Date Noted   Essential hypertension 05/09/2018   S/P angioplasty with stent, LCX 05/28/17 with DES 05/29/2017   HLD (hyperlipidemia) 05/29/2017   NSVT (nonsustained ventricular tachycardia) (HCC) 05/29/2017   Old non-ST elevation MI July 2018     REFERRING DIAG: R32 (ICD-10-CM) - Urinary leakage  THERAPY DIAG:  Urinary, incontinence, stress female  Stress incontinence (female) (female)  Muscle weakness  (generalized)  Abnormal posture  Unspecified lack of coordination  Rationale for Evaluation and Treatment Rehabilitation  PERTINENT HISTORY: G2P2  PRECAUTIONS: NA  SUBJECTIVE:                                                                                                                                                                                      SUBJECTIVE STATEMENT:  Pt states that she is feeling stronger in her pelvic floor and learning to contract before she coughs. She has not been able to focus on exercises as much as she would like the last several weeks.  PAIN:  Are you having pain? No   EVAL SUBJECTIVE STATEMENT: Pt states that she has been having difficulty with urinary leakage and the start of a prolapse. She feels like she has been having difficulty since having children, but it wasn't as bothersome. She can now feel something in the vagina, but it has recently gotten better. She has been doing kegels. She sees chiropractor for occasional Rt low back/hip support.  Fluid intake: Yes: "not enough" working on drinking more   PAIN:  Are you having pain? No   PRECAUTIONS: None  WEIGHT BEARING RESTRICTIONS: No  FALLS:  Has patient fallen in last 6 months? No  LIVING ENVIRONMENT: Lives with: lives with their spouse Lives in: House/apartment  OCCUPATION: retired   PLOF: Independent  PATIENT GOALS: unsure - she told MD she feels like she's coming apart  PERTINENT HISTORY:  G2P2 Sexual abuse: No  BOWEL MOVEMENT: Pain with bowel movement: No Type of bowel movement:Frequency 1x/day and Strain No  Fully empty rectum: No - uncertain Leakage: No Pads: Yes: - Fiber supplement: No  URINATION: Pain with urination: No Fully empty bladder: Yes: - Stream: Strong Urgency: No Frequency: 4x/day, 1x/night some nights  Leakage: Walking to the bathroom and Coughing Pads: Yes: panty liner daily  INTERCOURSE: Pain with intercourse: Initial Penetration and  During Penetration Ability to have vaginal penetration:  Yes: - Climax: yes   PREGNANCY: Vaginal deliveries 2 Tearing Yes: episiotomy   PROLAPSE: Sensation of vaginal bulge    OBJECTIVE:  03/06/23:  COGNITION: Overall cognitive status: Within functional limits for tasks assessed     SENSATION: Light touch: Appears intact Proprioception: Appears intact  MUSCLE LENGTH:   FUNCTIONAL TESTS: breath holding and increased abdominal pressure/bearing down with sit up   GAIT: Comments: WNL  POSTURE: rounded shoulders, forward head, increased thoracic kyphosis, and Lt lumbar rotation  PALPATION:   General  Tenderness in Lt lower quadrant                External Perineal Exam Lt labia minor fusion; moderate dryness                             Internal Pelvic Floor No tenderness or restriction throughout superficial/deep muscle layers   Patient confirms identification and approves PT to assess internal pelvic floor and treatment Yes  PELVIC MMT:   MMT eval  Vaginal 3-/5 strength, 4 second endurance, 6 repeat contractions in 10 seconds  Internal Anal Sphincter   External Anal Sphincter   Puborectalis   Diastasis Recti 1 finger width separation with distortion above umbilicus  (Blank rows = not tested)        TONE: low  PROLAPSE: Grade 2 anterior vaginal wall laxity   TODAY'S TREATMENT:  DATE:  05/02/23 Neuromuscular re-education: Bridge with hip adduction/pelvic floor/core 2 x 10 Bridge with march 10x bil Supine LE extensions 10x bil Dead bug 2 x 10 Seated D2 10x bil 3lbs Seated march with anterior weight hold 2lbs 2 x 10   04/04/23 Manual:  Neuromuscular re-education: Bridge with hip adduction/pelvic floor/core 2 x 10 Bridge with march 10x bil Supine LE extensions 10x bil Dead bug 2 x 10 Seated D2 10x bil red band Seated march  with anterior weight hold 2 x 10 Exercises: Happy baby 10 breaths Lower trunk rotation 2 x 10    03/21/23 Neuromuscular re-education: Pelvic floor contraction training in standing Transversus abdominus training with multimodal cues for improved motor control and breath coordination Supine UE ball press with core/pelvic floor 10x Supine hip adduction with core/pelvic floor 10x Side lying UE ball press with core/pelvic floor activation 10x bil Therapeutic activities: Pressure management  Pelvic floor/deep core anatomy with poster demonstration   PATIENT EDUCATION:  Education details: See above Person educated: Patient Education method: Explanation, Demonstration, Tactile cues, Verbal cues, and Handouts Education comprehension: verbalized understanding  HOME EXERCISE PROGRAM: [id number]  ASSESSMENT:  CLINICAL IMPRESSION: Pt doing better with bladder control and use of the knack. She has not been performing regular strengthening, therefore progressions from last visit reviewed. Verbal and tactile cues provided for improved breath coordination and core facilitation. She had difficulty with correct pressure management in deadbug; we discussed when to recognize this and how to modify exercise accordingly. She will continue to benefit from skilled PT intervention in order to decrease incontinence, improve sensation of vaginal bulge, and correct pressure management to prevent prolapse symptoms from worsening.    OBJECTIVE IMPAIRMENTS: decreased activity tolerance, decreased coordination, decreased endurance, decreased strength, increased fascial restrictions, increased muscle spasms, impaired tone, postural dysfunction, and pain.   ACTIVITY LIMITATIONS: continence  PARTICIPATION LIMITATIONS: community activity  PERSONAL FACTORS: 1 comorbidity: G2P2 are also affecting patient's functional outcome.   REHAB POTENTIAL: Good  CLINICAL DECISION MAKING: Stable/uncomplicated  EVALUATION  COMPLEXITY: Low   GOALS: Goals reviewed with patient? Yes  SHORT TERM GOALS: Target date: 04/17/23 - updated 05/02/23  Pt will be independent with HEP.   Baseline: Goal status: MET 05/02/23  2.  Pt will be independent with the knack, urge suppression technique, and double voiding in order to improve bladder habits and decrease urinary incontinence.   Baseline:  Goal status: MET 05/02/23  3.  Pt will be able to correctly perform diaphragmatic breathing and appropriate pressure management in order to prevent worsening vaginal wall laxity and improve pelvic floor A/ROM.   Baseline:  Goal status: MET 05/02/23   LONG TERM GOALS: Target date: 05/15/2023 - updated 05/02/23  Pt will be independent with advanced HEP.   Baseline:  Goal status: IN PROGRESS  2.  Pt will demonstrate normal pelvic floor muscle tone and A/ROM, able to achieve 4/5 strength with contractions and 10 sec endurance, in order to provide appropriate lumbopelvic support in functional activities.   Baseline:  Goal status: IN PROGRESS  3.  Pt will report no leaks with laughing, coughing, sneezing in order to improve comfort with interpersonal relationships and community activities.   Baseline:  Goal status: IN PROGRESS  4.  Pt will see improvement in vaginal wall laxity from Grade 2 to Grade 1 and not have any bothersome symptoms. Baseline:  Goal status: IN PROGRESS   PLAN:  PT FREQUENCY: 1-2x/week  PT DURATION: 10 weeks  PLANNED INTERVENTIONS: Therapeutic exercises, Therapeutic activity, Neuromuscular  re-education, Balance training, Gait training, Patient/Family education, Self Care, Joint mobilization, Dry Needling, Biofeedback, and Manual therapy  PLAN FOR NEXT SESSION: Continue to progress core/hip strengthening activities in seated and standing positions (2 more appointments)   Julio Alm, PT, DPT06/20/242:38 PM

## 2023-05-09 ENCOUNTER — Ambulatory Visit: Payer: PPO

## 2023-05-09 DIAGNOSIS — R279 Unspecified lack of coordination: Secondary | ICD-10-CM

## 2023-05-09 DIAGNOSIS — M6281 Muscle weakness (generalized): Secondary | ICD-10-CM

## 2023-05-09 DIAGNOSIS — N393 Stress incontinence (female) (male): Secondary | ICD-10-CM

## 2023-05-09 DIAGNOSIS — R293 Abnormal posture: Secondary | ICD-10-CM

## 2023-05-09 NOTE — Therapy (Signed)
OUTPATIENT PHYSICAL THERAPY TREATMENT NOTE   Patient Name: Maria Lamb MRN: [id number] DOB:April 22, 1951, 72 y.o., female Today's Date: 05/09/2023  PCP: Lupita Raider, MD  REFERRING PROVIDER: Lupita Raider, MD  END OF SESSION:   PT End of Session - 05/09/23 1355     Visit Number 5    Date for PT Re-Evaluation 06/06/23    Authorization Type HTA    PT Start Time 0200    PT Stop Time 0240    PT Time Calculation (min) 40 min    Activity Tolerance Patient tolerated treatment well    Behavior During Therapy [facility] for tasks assessed/performed               Past Medical History:  Diagnosis Date   Anxiety    HLD (hyperlipidemia) 05/29/2017   HSV infection    Hypertension    Significant white coat syndrome   NSTEMI (non-ST elevated myocardial infarction) (HCC)    NSVT (nonsustained ventricular tachycardia) (HCC) 05/29/2017   Old non-ST elevation MI July 2018    Psoriasis    S/P angioplasty with stent, LCX 05/28/17 with DES 05/29/2017   Past Surgical History:  Procedure Laterality Date   CORONARY STENT INTERVENTION N/A 05/28/2017   Procedure: Coronary Stent Intervention;  Surgeon: Swaziland, Peter M, MD;  Location: [facility] INVASIVE CV LAB;  Service: Cardiovascular;  Laterality: N/A;   LEFT HEART CATH AND CORONARY ANGIOGRAPHY N/A 05/28/2017   Procedure: Left Heart Cath and Coronary Angiography;  Surgeon: Swaziland, Peter M, MD;  Location: [facility] INVASIVE CV LAB;  Service: Cardiovascular;  Laterality: N/A;   TONSILLECTOMY AND ADENOIDECTOMY     Patient Active Problem List   Diagnosis Date Noted   Essential hypertension 05/09/2018   S/P angioplasty with stent, LCX 05/28/17 with DES 05/29/2017   HLD (hyperlipidemia) 05/29/2017   NSVT (nonsustained ventricular tachycardia) (HCC) 05/29/2017   Old non-ST elevation MI July 2018     REFERRING DIAG: R32 (ICD-10-CM) - Urinary leakage  THERAPY DIAG:  Urinary, incontinence, stress female - Plan: PT plan of care cert/re-cert  Stress incontinence  (female) (female) - Plan: PT plan of care cert/re-cert  Muscle weakness (generalized) - Plan: PT plan of care cert/re-cert  Abnormal posture - Plan: PT plan of care cert/re-cert  Unspecified lack of coordination - Plan: PT plan of care cert/re-cert  Rationale for Evaluation and Treatment Rehabilitation  PERTINENT HISTORY: G2P2  PRECAUTIONS: NA  SUBJECTIVE:                                                                                                                                                                                      SUBJECTIVE STATEMENT:  Pt states that  she is not feeling prepared for internal pelvic floor muscle re-assessment today due to diarrhea.  She feels like she is much more conscious of not holding core tight all the time, and only doing this contraction when needed. She states that she rarely is having any leaking with coughing.    PAIN:  Are you having pain? No   EVAL SUBJECTIVE STATEMENT: Pt states that she has been having difficulty with urinary leakage and the start of a prolapse. She feels like she has been having difficulty since having children, but it wasn't as bothersome. She can now feel something in the vagina, but it has recently gotten better. She has been doing kegels. She sees chiropractor for occasional Rt low back/hip support.  Fluid intake: Yes: "not enough" working on drinking more   PAIN:  Are you having pain? No   PRECAUTIONS: None  WEIGHT BEARING RESTRICTIONS: No  FALLS:  Has patient fallen in last 6 months? No  LIVING ENVIRONMENT: Lives with: lives with their spouse Lives in: House/apartment  OCCUPATION: retired   PLOF: Independent  PATIENT GOALS: unsure - she told MD she feels like she's coming apart  PERTINENT HISTORY:  G2P2 Sexual abuse: No  BOWEL MOVEMENT: Pain with bowel movement: No Type of bowel movement:Frequency 1x/day and Strain No  Fully empty rectum: No - uncertain Leakage: No Pads: Yes: - Fiber  supplement: No  URINATION: Pain with urination: No Fully empty bladder: Yes: - Stream: Strong Urgency: No Frequency: 4x/day, 1x/night some nights  Leakage: Walking to the bathroom and Coughing Pads: Yes: panty liner daily  INTERCOURSE: Pain with intercourse: Initial Penetration and During Penetration Ability to have vaginal penetration:  Yes: - Climax: yes   PREGNANCY: Vaginal deliveries 2 Tearing Yes: episiotomy   PROLAPSE: Sensation of vaginal bulge    OBJECTIVE:  05/09/23: No formal pelvic floor muscle assessment performed this session due to patient comfort level and not feeling well.   Abdomen:  -no distortion with sit-up test -Improved awareness of whether she is increasing pressure or drawing in to achieve core activation - good ability to perform core activation with appropriate pressure management ->1 finger width diastasis recti at and above umbilicus -No Lt lower quadrant tenderness -ability to perform full supine dead bug with appropriate pressure management and maintaining neutral spine on table  03/06/23: COGNITION: Overall cognitive status: Within functional limits for tasks assessed     SENSATION: Light touch: Appears intact Proprioception: Appears intact  MUSCLE LENGTH:   FUNCTIONAL TESTS: breath holding and increased abdominal pressure/bearing down with sit up   GAIT: Comments: WNL  POSTURE: rounded shoulders, forward head, increased thoracic kyphosis, and Lt lumbar rotation  PALPATION:   General  Tenderness in Lt lower quadrant                External Perineal Exam Lt labia minor fusion; moderate dryness                             Internal Pelvic Floor No tenderness or restriction throughout superficial/deep muscle layers   Patient confirms identification and approves PT to assess internal pelvic floor and treatment Yes  PELVIC MMT:   MMT eval  Vaginal 3-/5 strength, 4 second endurance, 6 repeat contractions in 10 seconds   Internal Anal Sphincter   External Anal Sphincter   Puborectalis   Diastasis Recti 1 finger width separation with distortion above umbilicus  (Blank rows = not tested)  TONE: low  PROLAPSE: Grade 2 anterior vaginal wall laxity   TODAY'S TREATMENT:                                                                                                                              DATE:  05/09/23 Re-evaluation Neuromuscular re-education: Dead bug 2 x 10 Bridge 02/04/2023 6x Bird-dog with UE ball press and opposite leg extended 10x bil Seated march yellow loop 2 x 10 Seated hip abduction yellow loop 2 x 10 Standing march with anterior weight hold 2lbs 2 x 10 Standing heel raises 2 x 10 Exercises: Lower trunk rotation 10x Cat cow 10x   05/02/23 Neuromuscular re-education: Bridge with hip adduction/pelvic floor/core 2 x 10 Bridge with march 10x bil Supine LE extensions 10x bil Dead bug 2 x 10 Seated D2 10x bil 3lbs Seated march with anterior weight hold 2lbs 2 x 10   04/04/23 Neuromuscular re-education: Bridge with hip adduction/pelvic floor/core 2 x 10 Bridge with march 10x bil Supine LE extensions 10x bil Dead bug 2 x 10 Seated D2 10x bil red band Seated march with anterior weight hold 2 x 10 Exercises: Happy baby 10 breaths Lower trunk rotation 2 x 10   PATIENT EDUCATION:  Education details: See above Person educated: Patient Education method: Programmer, multimedia, Demonstration, Tactile cues, Verbal cues, and Handouts Education comprehension: verbalized understanding  HOME EXERCISE PROGRAM: [id number]  ASSESSMENT:  CLINICAL IMPRESSION: Pt is overall seeing good improvements in bladder control and decreased symptoms of vaginal pressure. She is not having any episodes of stress urinary incontinence. When showering at night, she only sometimes notices vaginal bulge. She has been able to make excellent corrections to abdominal pressure management and awareness of abdominal  clenching; this will benefit her in reducing the amount of vaginal wall laxity that she currently has and prevent this from getting worse in the future. We did not perform internal pelvic floor muscle assessment this session due to patient being uncomfortable, but we will plan to next session at discharge in order to assess improvements in vaginal wall laxity and pelvic floor muscle strength. She will continue to benefit from skilled PT intervention in order to decrease incontinence, improve sensation of vaginal bulge, and correct pressure management to prevent prolapse symptoms from worsening.    OBJECTIVE IMPAIRMENTS: decreased activity tolerance, decreased coordination, decreased endurance, decreased strength, increased fascial restrictions, increased muscle spasms, impaired tone, postural dysfunction, and pain.   ACTIVITY LIMITATIONS: continence  PARTICIPATION LIMITATIONS: community activity  PERSONAL FACTORS: 1 comorbidity: G2P2 are also affecting patient's functional outcome.   REHAB POTENTIAL: Good  CLINICAL DECISION MAKING: Stable/uncomplicated  EVALUATION COMPLEXITY: Low   GOALS: Goals reviewed with patient? Yes  SHORT TERM GOALS: Target date: 04/17/23 - updated 05/02/23  Pt will be independent with HEP.   Baseline: Goal status: MET 05/02/23  2.  Pt will be independent with the knack, urge suppression technique, and double voiding in order to improve bladder habits and decrease  urinary incontinence.   Baseline:  Goal status: MET 05/02/23  3.  Pt will be able to correctly perform diaphragmatic breathing and appropriate pressure management in order to prevent worsening vaginal wall laxity and improve pelvic floor A/ROM.   Baseline:  Goal status: MET 05/02/23   LONG TERM GOALS: Target date: 05/15/2023 - updated 05/02/23 - updated 05/09/23  Pt will be independent with advanced HEP.   Baseline:  Goal status: IN PROGRESS  2.  Pt will demonstrate normal pelvic floor muscle tone  and A/ROM, able to achieve 4/5 strength with contractions and 10 sec endurance, in order to provide appropriate lumbopelvic support in functional activities.   Baseline: Not able to formally assess today due to patient's discomfort (05/09/23) Goal status: IN PROGRESS  3.  Pt will report no leaks with laughing, coughing, sneezing in order to improve comfort with interpersonal relationships and community activities.   Baseline:  Goal status: MET 05/09/23  4.  Pt will see improvement in vaginal wall laxity from Grade 2 to Grade 1 and not have any bothersome symptoms. Baseline: Did not assess grade of prolapse today due to patient comfort level, but she is not bothered by prolapse symptoms any more and does not consistently feel them (05/09/23) Goal status: IN PROGRESS   PLAN:  PT FREQUENCY: 1-2x/week  PT DURATION: 10 weeks  PLANNED INTERVENTIONS: Therapeutic exercises, Therapeutic activity, Neuromuscular re-education, Balance training, Gait training, Patient/Family education, Self Care, Joint mobilization, Dry Needling, Biofeedback, and Manual therapy  PLAN FOR NEXT SESSION: Possibly discharge; progress HEP.    Julio Alm, PT, DPT06/27/242:42 PM

## 2023-05-13 ENCOUNTER — Other Ambulatory Visit: Payer: Self-pay

## 2023-05-13 MED ORDER — METOPROLOL SUCCINATE ER 25 MG PO TB24: 50.0000 mg | ORAL_TABLET | Freq: Every day | ORAL | 1 refills | Status: DC

## 2023-05-23 ENCOUNTER — Ambulatory Visit: Payer: PPO | Attending: Family Medicine

## 2023-05-23 DIAGNOSIS — R293 Abnormal posture: Secondary | ICD-10-CM | POA: Insufficient documentation

## 2023-05-23 DIAGNOSIS — R279 Unspecified lack of coordination: Secondary | ICD-10-CM | POA: Diagnosis not present

## 2023-05-23 DIAGNOSIS — N393 Stress incontinence (female) (male): Secondary | ICD-10-CM | POA: Diagnosis not present

## 2023-05-23 DIAGNOSIS — M6281 Muscle weakness (generalized): Secondary | ICD-10-CM | POA: Insufficient documentation

## 2023-05-23 NOTE — Therapy (Signed)
OUTPATIENT PHYSICAL THERAPY TREATMENT NOTE   Patient Name: Maria Lamb MRN: [id number] DOB:02-27-1951, 72 y.o., female Today's Date: 05/23/2023  PCP: Lupita Raider, MD  REFERRING PROVIDER: Lupita Raider, MD  END OF SESSION:   PT End of Session - 05/23/23 1400     Visit Number 6    Date for PT Re-Evaluation 06/06/23    Authorization Type HTA    PT Start Time 1400    PT Stop Time 1440    PT Time Calculation (min) 40 min    Activity Tolerance Patient tolerated treatment well    Behavior During Therapy [facility] for tasks assessed/performed               Past Medical History:  Diagnosis Date   Anxiety    HLD (hyperlipidemia) 05/29/2017   HSV infection    Hypertension    Significant white coat syndrome   NSTEMI (non-ST elevated myocardial infarction) (HCC)    NSVT (nonsustained ventricular tachycardia) (HCC) 05/29/2017   Old non-ST elevation MI July 2018    Psoriasis    S/P angioplasty with stent, LCX 05/28/17 with DES 05/29/2017   Past Surgical History:  Procedure Laterality Date   CORONARY STENT INTERVENTION N/A 05/28/2017   Procedure: Coronary Stent Intervention;  Surgeon: Swaziland, Peter M, MD;  Location: [facility] INVASIVE CV LAB;  Service: Cardiovascular;  Laterality: N/A;   LEFT HEART CATH AND CORONARY ANGIOGRAPHY N/A 05/28/2017   Procedure: Left Heart Cath and Coronary Angiography;  Surgeon: Swaziland, Peter M, MD;  Location: [facility] INVASIVE CV LAB;  Service: Cardiovascular;  Laterality: N/A;   TONSILLECTOMY AND ADENOIDECTOMY     Patient Active Problem List   Diagnosis Date Noted   Essential hypertension 05/09/2018   S/P angioplasty with stent, LCX 05/28/17 with DES 05/29/2017   HLD (hyperlipidemia) 05/29/2017   NSVT (nonsustained ventricular tachycardia) (HCC) 05/29/2017   Old non-ST elevation MI July 2018     REFERRING DIAG: R32 (ICD-10-CM) - Urinary leakage  THERAPY DIAG:  Urinary, incontinence, stress female  Stress incontinence (female) (female)  Muscle weakness  (generalized)  Abnormal posture  Unspecified lack of coordination  Rationale for Evaluation and Treatment Rehabilitation  PERTINENT HISTORY: G2P2  PRECAUTIONS: NA  SUBJECTIVE:                                                                                                                                                                                      SUBJECTIVE STATEMENT:  Pt states that she has been feeling more pressure in the last two weeks, and she isn't sure why. She is still not having any leakage, but she can see/feel her uterus more. She feels like she cannot  get as good of a pelvic floor muscle contraction.    PAIN:  Are you having pain? No   EVAL SUBJECTIVE STATEMENT: Pt states that she has been having difficulty with urinary leakage and the start of a prolapse. She feels like she has been having difficulty since having children, but it wasn't as bothersome. She can now feel something in the vagina, but it has recently gotten better. She has been doing kegels. She sees chiropractor for occasional Rt low back/hip support.  Fluid intake: Yes: "not enough" working on drinking more   PAIN:  Are you having pain? No   PRECAUTIONS: None  WEIGHT BEARING RESTRICTIONS: No  FALLS:  Has patient fallen in last 6 months? No  LIVING ENVIRONMENT: Lives with: lives with their spouse Lives in: House/apartment  OCCUPATION: retired   PLOF: Independent  PATIENT GOALS: unsure - she told MD she feels like she's coming apart  PERTINENT HISTORY:  G2P2 Sexual abuse: No  BOWEL MOVEMENT: Pain with bowel movement: No Type of bowel movement:Frequency 1x/day and Strain No  Fully empty rectum: No - uncertain Leakage: No Pads: Yes: - Fiber supplement: No  URINATION: Pain with urination: No Fully empty bladder: Yes: - Stream: Strong Urgency: No Frequency: 4x/day, 1x/night some nights  Leakage: Walking to the bathroom and Coughing Pads: Yes: panty liner  daily  INTERCOURSE: Pain with intercourse: Initial Penetration and During Penetration Ability to have vaginal penetration:  Yes: - Climax: yes   PREGNANCY: Vaginal deliveries 2 Tearing Yes: episiotomy   PROLAPSE: Sensation of vaginal bulge    OBJECTIVE:  05/23/23 Patient confirms identification and approves PT to assess internal pelvic floor and treatment Yes  PELVIC MMT:   MMT eval  Vaginal 3-/5 strength, 4 second endurance, 6 repeat contractions in 10 seconds  Internal Anal Sphincter   External Anal Sphincter   Puborectalis   Diastasis Recti 1 finger width separation with distortion above umbilicus  (Blank rows = not tested)        TONE: low  PROLAPSE: Grade 2 anterior vaginal wall laxity   05/09/23: No formal pelvic floor muscle assessment performed this session due to patient comfort level and not feeling well.   Abdomen:  -no distortion with sit-up test -Improved awareness of whether she is increasing pressure or drawing in to achieve core activation - good ability to perform core activation with appropriate pressure management ->1 finger width diastasis recti at and above umbilicus -No Lt lower quadrant tenderness -ability to perform full supine dead bug with appropriate pressure management and maintaining neutral spine on table  03/06/23: COGNITION: Overall cognitive status: Within functional limits for tasks assessed     SENSATION: Light touch: Appears intact Proprioception: Appears intact  MUSCLE LENGTH:   FUNCTIONAL TESTS: breath holding and increased abdominal pressure/bearing down with sit up   GAIT: Comments: WNL  POSTURE: rounded shoulders, forward head, increased thoracic kyphosis, and Lt lumbar rotation  PALPATION:   General  Tenderness in Lt lower quadrant                External Perineal Exam Lt labia minor fusion; moderate dryness                             Internal Pelvic Floor No tenderness or restriction throughout  superficial/deep muscle layers   Patient confirms identification and approves PT to assess internal pelvic floor and treatment Yes  PELVIC MMT:   MMT eval  Vaginal 3-/5  strength, 4 second endurance, 6 repeat contractions in 10 seconds  Internal Anal Sphincter   External Anal Sphincter   Puborectalis   Diastasis Recti 1 finger width separation with distortion above umbilicus  (Blank rows = not tested)        TONE: low  PROLAPSE: Grade 2 anterior vaginal wall laxity   TODAY'S TREATMENT:                                                                                                                              DATE:  05/23/23 Manual: Pt provides verbal consent for internal vaginal/rectal pelvic floor exam. Pelvic floor muscle reassessment Therapeutic activities: Education on pessary Poise impressa Inverted lying   05/09/23 Re-evaluation Neuromuscular re-education: Dead bug 2 x 10 Bridge 01-25-2023 6x Bird-dog with UE ball press and opposite leg extended 10x bil Seated march yellow loop 2 x 10 Seated hip abduction yellow loop 2 x 10 Standing march with anterior weight hold 2lbs 2 x 10 Standing heel raises 2 x 10 Exercises: Lower trunk rotation 10x Cat cow 10x   05/02/23 Neuromuscular re-education: Bridge with hip adduction/pelvic floor/core 2 x 10 Bridge with march 10x bil Supine LE extensions 10x bil Dead bug 2 x 10 Seated D2 10x bil 3lbs Seated march with anterior weight hold 2lbs 2 x 10   04/04/23 Neuromuscular re-education: Bridge with hip adduction/pelvic floor/core 2 x 10 Bridge with march 10x bil Supine LE extensions 10x bil Dead bug 2 x 10 Seated D2 10x bil red band Seated march with anterior weight hold 2 x 10 Exercises: Happy baby 10 breaths Lower trunk rotation 2 x 10   PATIENT EDUCATION:  Education details: See above Person educated: Patient Education method: Programmer, multimedia, Demonstration, Tactile cues, Verbal cues, and Handouts Education  comprehension: verbalized understanding  HOME EXERCISE PROGRAM: [id number]  ASSESSMENT:  CLINICAL IMPRESSION: Due to pt feeling slight set back with more vaginal pressure, we discussed inverted lying position being helpful. Likely that increased pressure has corresponded to being busier and having less time for exercises; these exacerbations can be normal as long as she feels like she can get them back under control. We reassessed pelvic floor muscle strength and endurance, both of which were improved. She only presented with very mild anterior vaginal wall laxity today with no increased descent of uterus with bearing down in supine. She did have some trouble with appropriate bearing down. No great concern for prolapse at this time, especially with minimal symptoms such as pain, urinary incontinence, or bowel dysfunction. Due to increase in symptoms, she does not want to discharge today, but keep coming at frequency of every other week. She will continue to benefit from skilled PT intervention in order to decrease incontinence, improve sensation of vaginal bulge, and correct pressure management to prevent prolapse symptoms from worsening.    OBJECTIVE IMPAIRMENTS: decreased activity tolerance, decreased coordination, decreased endurance, decreased strength, increased fascial restrictions, increased muscle spasms, impaired tone, postural dysfunction,  and pain.   ACTIVITY LIMITATIONS: continence  PARTICIPATION LIMITATIONS: community activity  PERSONAL FACTORS: 1 comorbidity: G2P2 are also affecting patient's functional outcome.   REHAB POTENTIAL: Good  CLINICAL DECISION MAKING: Stable/uncomplicated  EVALUATION COMPLEXITY: Low   GOALS: Goals reviewed with patient? Yes  SHORT TERM GOALS: Target date: 04/17/23 - updated 05/02/23  Pt will be independent with HEP.   Baseline: Goal status: MET 05/02/23  2.  Pt will be independent with the knack, urge suppression technique, and double voiding in  order to improve bladder habits and decrease urinary incontinence.   Baseline:  Goal status: MET 05/02/23  3.  Pt will be able to correctly perform diaphragmatic breathing and appropriate pressure management in order to prevent worsening vaginal wall laxity and improve pelvic floor A/ROM.   Baseline:  Goal status: MET 05/02/23   LONG TERM GOALS: Target date: 05/15/2023 - updated 05/02/23 - updated 05/09/23  Pt will be independent with advanced HEP.   Baseline:  Goal status: IN PROGRESS  2.  Pt will demonstrate normal pelvic floor muscle tone and A/ROM, able to achieve 4/5 strength with contractions and 10 sec endurance, in order to provide appropriate lumbopelvic support in functional activities.   Baseline: Not able to formally assess today due to patient's discomfort (05/09/23) Goal status: IN PROGRESS  3.  Pt will report no leaks with laughing, coughing, sneezing in order to improve comfort with interpersonal relationships and community activities.   Baseline:  Goal status: MET 05/09/23  4.  Pt will see improvement in vaginal wall laxity from Grade 2 to Grade 1 and not have any bothersome symptoms. Baseline: Did not assess grade of prolapse today due to patient comfort level, but she is not bothered by prolapse symptoms any more and does not consistently feel them (05/09/23) Goal status: IN PROGRESS   PLAN:  PT FREQUENCY: 1-2x/week  PT DURATION: 10 weeks  PLANNED INTERVENTIONS: Therapeutic exercises, Therapeutic activity, Neuromuscular re-education, Balance training, Gait training, Patient/Family education, Self Care, Joint mobilization, Dry Needling, Biofeedback, and Manual therapy  PLAN FOR NEXT SESSION: Re-evaluate if necessary; progress functional strengthening with pressure management strategies.    Julio Alm, PT, DPT07/11/242:01 PM

## 2023-05-29 ENCOUNTER — Inpatient Hospital Stay: Admission: RE | Admit: 2023-05-29 | Payer: PPO | Source: Ambulatory Visit

## 2023-06-11 DIAGNOSIS — L259 Unspecified contact dermatitis, unspecified cause: Secondary | ICD-10-CM | POA: Diagnosis not present

## 2023-06-11 DIAGNOSIS — L299 Pruritus, unspecified: Secondary | ICD-10-CM | POA: Diagnosis not present

## 2023-06-11 DIAGNOSIS — R7303 Prediabetes: Secondary | ICD-10-CM | POA: Diagnosis not present

## 2023-07-25 DIAGNOSIS — N390 Urinary tract infection, site not specified: Secondary | ICD-10-CM | POA: Diagnosis not present

## 2023-07-25 DIAGNOSIS — R350 Frequency of micturition: Secondary | ICD-10-CM | POA: Diagnosis not present

## 2023-08-12 DIAGNOSIS — M9902 Segmental and somatic dysfunction of thoracic region: Secondary | ICD-10-CM | POA: Diagnosis not present

## 2023-08-12 DIAGNOSIS — M542 Cervicalgia: Secondary | ICD-10-CM | POA: Diagnosis not present

## 2023-08-12 DIAGNOSIS — M62830 Muscle spasm of back: Secondary | ICD-10-CM | POA: Diagnosis not present

## 2023-08-12 DIAGNOSIS — M9901 Segmental and somatic dysfunction of cervical region: Secondary | ICD-10-CM | POA: Diagnosis not present

## 2023-08-13 DIAGNOSIS — L409 Psoriasis, unspecified: Secondary | ICD-10-CM | POA: Diagnosis not present

## 2023-08-13 DIAGNOSIS — Z1211 Encounter for screening for malignant neoplasm of colon: Secondary | ICD-10-CM | POA: Diagnosis not present

## 2023-08-13 DIAGNOSIS — I2510 Atherosclerotic heart disease of native coronary artery without angina pectoris: Secondary | ICD-10-CM | POA: Diagnosis not present

## 2023-08-13 DIAGNOSIS — I119 Hypertensive heart disease without heart failure: Secondary | ICD-10-CM | POA: Diagnosis not present

## 2023-08-13 DIAGNOSIS — E782 Mixed hyperlipidemia: Secondary | ICD-10-CM | POA: Diagnosis not present

## 2023-08-13 DIAGNOSIS — Z87440 Personal history of urinary (tract) infections: Secondary | ICD-10-CM | POA: Diagnosis not present

## 2023-08-13 DIAGNOSIS — K589 Irritable bowel syndrome without diarrhea: Secondary | ICD-10-CM | POA: Diagnosis not present

## 2023-08-13 DIAGNOSIS — Z0000 Encounter for general adult medical examination without abnormal findings: Secondary | ICD-10-CM | POA: Diagnosis not present

## 2023-08-13 DIAGNOSIS — R7303 Prediabetes: Secondary | ICD-10-CM | POA: Diagnosis not present

## 2023-08-13 DIAGNOSIS — A6000 Herpesviral infection of urogenital system, unspecified: Secondary | ICD-10-CM | POA: Diagnosis not present

## 2023-08-13 DIAGNOSIS — F411 Generalized anxiety disorder: Secondary | ICD-10-CM | POA: Diagnosis not present

## 2023-08-13 LAB — LAB REPORT - SCANNED
A1c: 5.7
Creatinine, POC: 250 mg/dL
EGFR: 73
Microalb Creat Ratio: 6.2
Microalbumin, Urine: 1.55

## 2023-08-14 ENCOUNTER — Encounter: Payer: Self-pay | Admitting: Physician Assistant

## 2023-08-14 ENCOUNTER — Ambulatory Visit: Payer: PPO | Attending: Interventional Cardiology | Admitting: Physician Assistant

## 2023-08-14 VITALS — BP 126/78 | HR 71 | Ht 64.0 in | Wt 159.2 lb

## 2023-08-14 DIAGNOSIS — I2510 Atherosclerotic heart disease of native coronary artery without angina pectoris: Secondary | ICD-10-CM

## 2023-08-14 DIAGNOSIS — I10 Essential (primary) hypertension: Secondary | ICD-10-CM | POA: Diagnosis not present

## 2023-08-14 DIAGNOSIS — E785 Hyperlipidemia, unspecified: Secondary | ICD-10-CM

## 2023-08-14 DIAGNOSIS — I5189 Other ill-defined heart diseases: Secondary | ICD-10-CM

## 2023-08-14 NOTE — Progress Notes (Signed)
Cardiology Office Note:  .   Date:  08/14/2023  ID:  Maria Lamb, DOB [birth year], MRN [id number] PCP: Lupita Raider, MD   [facility] Providers Cardiologist:  Lesleigh Noe, MD (Inactive) {   History of Present Illness: .   Maria Lamb is a 72 y.o. female with a PMH of NSTEMI, NSVT, CAD with CFX stent 2018, hyperlipidemia, and primary HTN here for follow-up appointment.  He was seen last year by Dr. Katrinka Blazing and at that time there were no real cardiac complaints. She was walking about 1-1/2 miles 7 days a week with her dogs. No dyspnea or chest discomfort. Also, does yoga. No medication side effects.   Today, she is still walking. She has two toy poodles that she rescued. She has two boys one is in [place] and one in  . New grandmother! She has a two week old grandbaby. BP well controlled today. Tells me she stopped her statin because she wants a more natural approach to her care. Started on Policosanol and plans for follow-up labs in a few months with PCP.  Reports no shortness of breath nor dyspnea on exertion. Reports no chest pain, pressure, or tightness. No edema, orthopnea, PND. Reports no palpitations.    ROS: Pertinent ROS in HPI  Studies Reviewed: Marland Kitchen   EKG Interpretation Date/Time:  Wednesday August 14 2023 15:50:34 EDT Ventricular Rate:  71 PR Interval:  156 QRS Duration:  82 QT Interval:  426 QTC Calculation: 462 R Axis:   11  Text Interpretation: Normal sinus rhythm Normal ECG When compared with ECG of 15-Aug-2017 01:34, Vent. rate has decreased BY  43 BPM Confirmed by Jari Favre [phone]) on 08/14/2023 4:02:56 PM    Study Conclusions Echo 05/29/17  - Left ventricle: The cavity size was normal. There was mild    concentric hypertrophy. Systolic function was normal. The    estimated ejection fraction was in the range of 60% to 65%. Wall    motion was normal; there were no regional wall motion    abnormalities. Doppler parameters are consistent  with abnormal    left ventricular relaxation (grade 1 diastolic dysfunction).    There was no evidence of elevated ventricular filling pressure by    Doppler parameters.  - Aortic valve: There was mild regurgitation.  - Aortic root: The aortic root was normal in size.  - Left atrium: The atrium was normal in size.  - Right ventricle: Systolic function was normal.  - Tricuspid valve: There was no regurgitation.  - Pulmonary arteries: Systolic pressure could not be accurately    estimated.  - Inferior vena cava: The vessel was normal in size.  - Pericardium, extracardiac: There was no pericardial effusion.   -------------------------------------------------------------------  Study data:  No prior study was available for comparison.  Study  status:  Routine.  Procedure:  The patient reported no pain pre or  post test. Transthoracic echocardiography. Image quality was  adequate.  Study completion:  There were no complications.  Transthoracic echocardiography.  M-mode, complete 2D, spectral  Doppler, and color Doppler.  Birthdate:  Patient birthdate:  10/09/1951.  Age:  Patient is 72 yr old.  Sex:  Gender: female.  BMI: 23.4 kg/m^2.  Blood pressure:     127/75  Patient status:  Inpatient.  Study date:  Study date: 05/29/2017. Study time: 12:44  PM.  Location:  Bedside.   -------------------------------------------------------------------   -------------------------------------------------------------------  Left ventricle:  The cavity size was normal. There was mild  concentric hypertrophy. Systolic function was normal. The estimated  ejection fraction was in the range of 60% to 65%. Wall motion was  normal; there were no regional wall motion abnormalities. Doppler  parameters are consistent with abnormal left ventricular relaxation  (grade 1 diastolic dysfunction). There was no evidence of elevated  ventricular filling pressure by Doppler parameters.    -------------------------------------------------------------------  Aortic valve:   Trileaflet; mildly thickened, mildly calcified  leaflets. Mobility was not restricted.  Doppler:  Transvalvular  velocity was within the normal range. There was no stenosis. There  was mild regurgitation.   -------------------------------------------------------------------  Aorta: Aortic root: The aortic root was normal in size.   -------------------------------------------------------------------  Mitral valve:   Structurally normal valve.   Mobility was not  restricted.  Doppler:  Transvalvular velocity was within the normal  range. There was no evidence for stenosis. There was no  regurgitation.    Peak gradient (D): 2 mm Hg.   -------------------------------------------------------------------  Left atrium:  The atrium was normal in size.   -------------------------------------------------------------------  Right ventricle:  The cavity size was normal. Wall thickness was  normal. Systolic function was normal.   -------------------------------------------------------------------  Pulmonic valve:    Structurally normal valve.   Cusp separation was  normal.  Doppler:  Transvalvular velocity was within the normal  range. There was no evidence for stenosis. There was no  regurgitation.   -------------------------------------------------------------------  Tricuspid valve:   Structurally normal valve.    Doppler:  Transvalvular velocity was within the normal range. There was no  regurgitation.   -------------------------------------------------------------------  Pulmonary artery:   The main pulmonary artery was normal-sized.  Systolic pressure could not be accurately estimated.   -------------------------------------------------------------------  Right atrium:  The atrium was normal in size.   -------------------------------------------------------------------  Pericardium: There was no  pericardial effusion.   -------------------------------------------------------------------  Systemic veins:  Inferior vena cava: The vessel was normal in size.        Physical Exam:   VS:  BP 126/78   Pulse 71   Ht 5\' 4"  (1.626 m)   Wt 159 lb 3.2 oz (72.2 kg)   SpO2 96%   BMI 27.33 kg/m    Wt Readings from Last 3 Encounters:  08/14/23 159 lb 3.2 oz (72.2 kg)  05/31/22 165 lb 6.4 oz (75 kg)  03/08/21 154 lb 3.2 oz (69.9 kg)    GEN: Well nourished, well developed in no acute distress NECK: No JVD; No carotid bruits CARDIAC: RRR, no murmurs, rubs, gallops RESPIRATORY:  Clear to auscultation without rales, wheezing or rhonchi  ABDOMEN: Soft, non-tender, non-distended EXTREMITIES:  No edema; No deformity   ASSESSMENT AND PLAN: .   CAD s/p stenting in 2018 -she has not used introglycerin  in years -she is on metoprolol and this works well for her -no chest pains or SOB -continue current medications  HTN -blood pressure well controlled -continue current medication regimen -low sodium, heart healthy diet -continue yoga and walking for exercise  HLD -policosanol and nitric oxide -statin free right now -repeat lipid panel with PCP in 2 months   Diastolic Dysfunction -2018 echo mild Grade 1 DD -asymptomatic and euvolemic on exam today      Dispo: recommend follow-up in a year. She will pick her doctor at a later date  Signed, Sharlene Dory, PA-C

## 2023-08-14 NOTE — Patient Instructions (Signed)
Medication Instructions:   Your physician recommends that you continue on your current medications as directed. Please refer to the Current Medication list given to you today.  *If you need a refill on your cardiac medications before your next appointment, please call your pharmacy*   Lab Work:  MAKE SURE  IN THE NEXT 2 MONTHS YOU GET YOUR LIPIDS FROM PRIMARY CARE PROVIDER    If you have labs (blood work) drawn today and your tests are completely normal, you will receive your results only by: MyChart Message (if you have MyChart) OR A paper copy in the mail If you have any lab test that is abnormal or we need to change your treatment, we will call you to review the results.   Testing/Procedures: NONE ORDERED  TODAY     Follow-Up: At [facility], you and your health needs are our priority.  As part of our continuing mission to provide you with exceptional heart care, we have created designated Provider Care Teams.  These Care Teams include your primary Cardiologist (physician) and Advanced Practice Providers (APPs -  Physician Assistants and Nurse Practitioners) who all work together to provide you with the care you need, when you need it.  We recommend signing up for the patient portal called "MyChart".  Sign up information is provided on this After Visit Summary.  MyChart is used to connect with patients for Virtual Visits (Telemedicine).  Patients are able to view lab/test results, encounter notes, upcoming appointments, etc.  Non-urgent messages can be sent to your provider as well.   To learn more about what you can do with MyChart, go to [email or web address].    Your next appointment:   1 year(s)  Provider:    Jari Favre, PA-C     Other Instructions  Low-Sodium Eating Plan Salt (sodium) helps you keep a healthy balance of fluids in your body. Too much sodium can raise your blood pressure. It can also cause fluid and waste to be held in your body. Your health  care provider or dietitian may recommend a low-sodium eating plan if you have high blood pressure (hypertension), kidney disease, liver disease, or heart failure. Eating less sodium can help lower your blood pressure and reduce swelling. It can also protect your heart, liver, and kidneys. What are tips for following this plan? Reading food labels  Check food labels for the amount of sodium per serving. If you eat more than one serving, you must multiply the listed amount by the number of servings. Choose foods with less than 140 milligrams (mg) of sodium per serving. Avoid foods with 300 mg of sodium or more per serving. Always check how much sodium is in a product, even if the label says "unsalted" or "no salt added." Shopping  Buy products labeled as "low-sodium" or "no salt added." Buy fresh foods. Avoid canned foods and pre-made or frozen meals. Avoid canned, cured, or processed meats. Buy breads that have less than 80 mg of sodium per slice. Cooking  Eat more home-cooked food. Try to eat less restaurant, buffet, and fast food. Try not to add salt when you cook. Use salt-free seasonings or herbs instead of table salt or sea salt. Check with your provider or pharmacist before using salt substitutes. Cook with plant-based oils, such as canola, sunflower, or olive oil. Meal planning When eating at a restaurant, ask if your food can be made with less salt or no salt. Avoid dishes labeled as brined, pickled, cured, or smoked. Avoid dishes  made with soy sauce, miso, or teriyaki sauce. Avoid foods that have monosodium glutamate (MSG) in them. MSG may be added to some restaurant food, sauces, soups, bouillon, and canned foods. Make meals that can be grilled, baked, poached, roasted, or steamed. These are often made with less sodium. General information Try to limit your sodium intake to 1,500-2,300 mg each day, or the amount told by your provider. What foods should I eat? Fruits Fresh,  frozen, or canned fruit. Fruit juice. Vegetables Fresh or frozen vegetables. "No salt added" canned vegetables. "No salt added" tomato sauce and paste. Low-sodium or reduced-sodium tomato and vegetable juice. Grains Low-sodium cereals, such as oats, puffed wheat and rice, and shredded wheat. Low-sodium crackers. Unsalted rice. Unsalted pasta. Low-sodium bread. Whole grain breads and whole grain pasta. Meats and other proteins Fresh or frozen meat, poultry, seafood, and fish. These should have no added salt. Low-sodium canned tuna and salmon. Unsalted nuts. Dried peas, beans, and lentils without added salt. Unsalted canned beans. Eggs. Unsalted nut butters. Dairy Milk. Soy milk. Cheese that is naturally low in sodium, such as ricotta cheese, fresh mozzarella, or Swiss cheese. Low-sodium or reduced-sodium cheese. Cream cheese. Yogurt. Seasonings and condiments Fresh and dried herbs and spices. Salt-free seasonings. Low-sodium mustard and ketchup. Sodium-free salad dressing. Sodium-free light mayonnaise. Fresh or refrigerated horseradish. Lemon juice. Vinegar. Other foods Homemade, reduced-sodium, or low-sodium soups. Unsalted popcorn and pretzels. Low-salt or salt-free chips. The items listed above may not be all the foods and drinks you can have. Talk to a dietitian to learn more. What foods should I avoid? Vegetables Sauerkraut, pickled vegetables, and relishes. Olives. [place] fries. Onion rings. Regular canned vegetables, except low-sodium or reduced-sodium items. Regular canned tomato sauce and paste. Regular tomato and vegetable juice. Frozen vegetables in sauces. Grains Instant hot cereals. Bread stuffing, pancake, and biscuit mixes. Croutons. Seasoned rice or pasta mixes. Noodle soup cups. Boxed or frozen macaroni and cheese. Regular salted crackers. Self-rising flour. Meats and other proteins Meat or fish that is salted, canned, smoked, spiced, or pickled. Precooked or cured meat, such as  sausages or meat loaves. Tomasa Blase. Ham. Pepperoni. Hot dogs. Corned beef. Chipped beef. Salt pork. Jerky. Pickled herring, anchovies, and sardines. Regular canned tuna. Salted nuts. Dairy Processed cheese and cheese spreads. Hard cheeses. Cheese curds. Blue cheese. Feta cheese. String cheese. Regular cottage cheese. Buttermilk. Canned milk. Fats and oils Salted butter. Regular margarine. Ghee. Bacon fat. Seasonings and condiments Onion salt, garlic salt, seasoned salt, table salt, and sea salt. Canned and packaged gravies. Worcestershire sauce. Tartar sauce. Barbecue sauce. Teriyaki sauce. Soy sauce, including reduced-sodium soy sauce. Steak sauce. Fish sauce. Oyster sauce. Cocktail sauce. Horseradish that you find on the shelf. Regular ketchup and mustard. Meat flavorings and tenderizers. Bouillon cubes. Hot sauce. Pre-made or packaged marinades. Pre-made or packaged taco seasonings. Relishes. Regular salad dressings. Salsa. Other foods Salted popcorn and pretzels. Corn chips and puffs. Potato and tortilla chips. Canned or dried soups. Pizza. Frozen entrees and pot pies. The items listed above may not be all the foods and drinks you should avoid. Talk to a dietitian to learn more. This information is not intended to replace advice given to you by your health care provider. Make sure you discuss any questions you have with your health care provider. Document Revised: 11/15/2022 Document Reviewed: 11/15/2022 Elsevier Patient Education  2024 Elsevier Inc[employer]   Heart-Healthy Eating Plan Eating a healthy diet is important for the health of your heart. A heart-healthy eating plan includes: Eating  less unhealthy fats. Eating more healthy fats. Eating less salt in your food. Salt is also called sodium. Making other changes in your diet. Talk with your doctor or a diet specialist (dietitian) to create an eating plan that is right for you. What is my plan? Your doctor may recommend an eating plan that  includes: Total fat: ______% or less of total calories a day. Saturated fat: ______% or less of total calories a day. Cholesterol: less than _________mg a day. Sodium: less than _________mg a day. What are tips for following this plan? Cooking Avoid frying your food. Try to bake, boil, grill, or broil it instead. You can also reduce fat by: Removing the skin from poultry. Removing all visible fats from meats. Steaming vegetables in water or broth. Meal planning  At meals, divide your plate into four equal parts: Fill one-half of your plate with vegetables and green salads. Fill one-fourth of your plate with whole grains. Fill one-fourth of your plate with lean protein foods. Eat 2-4 cups of vegetables per day. One cup of vegetables is: 1 cup (91 g) broccoli or cauliflower florets. 2 medium carrots. 1 large bell pepper. 1 large sweet potato. 1 large tomato. 1 medium white potato. 2 cups (150 g) raw leafy greens. Eat 1-2 cups of fruit per day. One cup of fruit is: 1 small apple 1 large banana 1 cup (237 g) mixed fruit, 1 large orange,  cup (82 g) dried fruit, 1 cup (240 mL) 100% fruit juice. Eat more foods that have soluble fiber. These are apples, broccoli, carrots, beans, peas, and barley. Try to get 20-30 g of fiber per day. Eat 4-5 servings of nuts, legumes, and seeds per week: 1 serving of dried beans or legumes equals  cup (90 g) cooked. 1 serving of nuts is  oz (12 almonds, 24 pistachios, or 7 walnut halves). 1 serving of seeds equals  oz (8 g). General information Eat more home-cooked food. Eat less restaurant, buffet, and fast food. Limit or avoid alcohol. Limit foods that are high in starch and sugar. Avoid fried foods. Lose weight if you are overweight. Keep track of how much salt (sodium) you eat. This is important if you have high blood pressure. Ask your doctor to tell you more about this. Try to add vegetarian meals each week. Fats Choose healthy  fats. These include olive oil and canola oil, flaxseeds, walnuts, almonds, and seeds. Eat more omega-3 fats. These include salmon, mackerel, sardines, tuna, flaxseed oil, and ground flaxseeds. Try to eat fish at least 2 times each week. Check food labels. Avoid foods with trans fats or high amounts of saturated fat. Limit saturated fats. These are often found in animal products, such as meats, butter, and cream. These are also found in plant foods, such as palm oil, palm kernel oil, and coconut oil. Avoid foods with partially hydrogenated oils in them. These have trans fats. Examples are stick margarine, some tub margarines, cookies, crackers, and other baked goods. What foods should I eat? Fruits All fresh, canned (in natural juice), or frozen fruits. Vegetables Fresh or frozen vegetables (raw, steamed, roasted, or grilled). Green salads. Grains Most grains. Choose whole wheat and whole grains most of the time. Rice and pasta, including brown rice and pastas made with whole wheat. Meats and other proteins Lean, well-trimmed beef, veal, pork, and lamb. Chicken and [place] without skin. All fish and shellfish. Wild duck, rabbit, pheasant, and venison. Egg whites or low-cholesterol egg substitutes. Dried beans, peas, lentils,  and tofu. Seeds and most nuts. Dairy Low-fat or nonfat cheeses, including ricotta and mozzarella. Skim or 1% milk that is liquid, powdered, or evaporated. Buttermilk that is made with low-fat milk. Nonfat or low-fat yogurt. Fats and oils Non-hydrogenated (trans-free) margarines. Vegetable oils, including soybean, sesame, sunflower, olive, peanut, safflower, corn, canola, and cottonseed. Salad dressings or mayonnaise made with a vegetable oil. Beverages Mineral water. Coffee and tea. Diet carbonated beverages. Sweets and desserts Sherbet, gelatin, and fruit ice. Small amounts of dark chocolate. Limit all sweets and desserts. Seasonings and condiments All seasonings and  condiments. The items listed above may not be a complete list of foods and drinks you can eat. Contact a dietitian for more options. What foods should I avoid? Fruits Canned fruit in heavy syrup. Fruit in cream or butter sauce. Fried fruit. Limit coconut. Vegetables Vegetables cooked in cheese, cream, or butter sauce. Fried vegetables. Grains Breads that are made with saturated or trans fats, oils, or whole milk. Croissants. Sweet rolls. Donuts. High-fat crackers, such as cheese crackers. Meats and other proteins Fatty meats, such as hot dogs, ribs, sausage, bacon, rib-eye roast or steak. High-fat deli meats, such as salami and bologna. Caviar. Domestic duck and goose. Organ meats, such as liver. Dairy Cream, sour cream, cream cheese, and creamed cottage cheese. Whole-milk cheeses. Whole or 2% milk that is liquid, evaporated, or condensed. Whole buttermilk. Cream sauce or high-fat cheese sauce. Yogurt that is made from whole milk. Fats and oils Meat fat, or shortening. Cocoa butter, hydrogenated oils, palm oil, coconut oil, palm kernel oil. Solid fats and shortenings, including bacon fat, salt pork, lard, and butter. Nondairy cream substitutes. Salad dressings with cheese or sour cream. Beverages Regular sodas and juice drinks with added sugar. Sweets and desserts Frosting. Pudding. Cookies. Cakes. Pies. Milk chocolate or white chocolate. Buttered syrups. Full-fat ice cream or ice cream drinks. The items listed above may not be a complete list of foods and drinks to avoid. Contact a dietitian for more information. Summary Heart-healthy meal planning includes eating less unhealthy fats, eating more healthy fats, and making other changes in your diet. Eat a balanced diet. This includes fruits and vegetables, low-fat or nonfat dairy, lean protein, nuts and legumes, whole grains, and heart-healthy oils and fats. This information is not intended to replace advice given to you by your health care  provider. Make sure you discuss any questions you have with your health care provider. Document Revised: 12/04/2021 Document Reviewed: 12/04/2021 Elsevier Patient Education  2024 [employer].

## 2023-08-21 DIAGNOSIS — M9901 Segmental and somatic dysfunction of cervical region: Secondary | ICD-10-CM | POA: Diagnosis not present

## 2023-08-21 DIAGNOSIS — M62830 Muscle spasm of back: Secondary | ICD-10-CM | POA: Diagnosis not present

## 2023-08-21 DIAGNOSIS — M9902 Segmental and somatic dysfunction of thoracic region: Secondary | ICD-10-CM | POA: Diagnosis not present

## 2023-08-21 DIAGNOSIS — M542 Cervicalgia: Secondary | ICD-10-CM | POA: Diagnosis not present

## 2023-08-27 DIAGNOSIS — M542 Cervicalgia: Secondary | ICD-10-CM | POA: Diagnosis not present

## 2023-08-27 DIAGNOSIS — M9902 Segmental and somatic dysfunction of thoracic region: Secondary | ICD-10-CM | POA: Diagnosis not present

## 2023-08-27 DIAGNOSIS — M62830 Muscle spasm of back: Secondary | ICD-10-CM | POA: Diagnosis not present

## 2023-08-27 DIAGNOSIS — M9901 Segmental and somatic dysfunction of cervical region: Secondary | ICD-10-CM | POA: Diagnosis not present

## 2023-09-03 DIAGNOSIS — E119 Type 2 diabetes mellitus without complications: Secondary | ICD-10-CM | POA: Diagnosis not present

## 2023-09-25 ENCOUNTER — Ambulatory Visit: Payer: PPO | Admitting: Interventional Cardiology

## 2023-10-01 ENCOUNTER — Ambulatory Visit: Payer: PPO | Admitting: Physician Assistant

## 2023-10-23 DIAGNOSIS — L820 Inflamed seborrheic keratosis: Secondary | ICD-10-CM | POA: Diagnosis not present

## 2023-10-24 ENCOUNTER — Telehealth: Payer: Self-pay | Admitting: Physician Assistant

## 2023-10-24 NOTE — Telephone Encounter (Signed)
New Message:      Patient says she needs Tessa to please give her a call.She would not give any further details.

## 2023-10-24 NOTE — Telephone Encounter (Signed)
Called patient and she requested to speak to [facility] directly  Informed patient that Julian Hy was in clinic and is there something I can help her with. She states a referral for someone else.  I asked was the person a patient here. She was unsure.  Advise to have the person call office.

## 2023-10-30 ENCOUNTER — Telehealth: Payer: Self-pay | Admitting: Physician Assistant

## 2023-10-30 MED ORDER — METOPROLOL SUCCINATE ER 25 MG PO TB24: 50.0000 mg | ORAL_TABLET | Freq: Every day | ORAL | 2 refills | Status: DC

## 2023-10-30 NOTE — Telephone Encounter (Signed)
Pt's medication was sent to pt's pharmacy as requested. Confirmation received.  °

## 2023-10-30 NOTE — Telephone Encounter (Signed)
*  STAT* If patient is at the pharmacy, call can be transferred to refill team.   1. Which medications need to be refilled? (please list name of each medication and dose if known)   metoprolol succinate (TOPROL-XL) 25 MG 24 hr tablet   2. Would you like to learn more about the convenience, safety, & potential cost savings by using the [facility]?   3. Are you open to using the [facility] (Type [facility]. ).  4. Which pharmacy/location (including street and city if local pharmacy) is medication to be sent to?  [facility] [place], [place] - [address]   5. Do they need a 30 day or 90 day supply?   90 day  Patient stated she only has 2 tablets left.

## 2024-02-14 DIAGNOSIS — Z87440 Personal history of urinary (tract) infections: Secondary | ICD-10-CM | POA: Diagnosis not present

## 2024-02-14 DIAGNOSIS — L7211 Pilar cyst: Secondary | ICD-10-CM | POA: Diagnosis not present

## 2024-02-14 DIAGNOSIS — I2510 Atherosclerotic heart disease of native coronary artery without angina pectoris: Secondary | ICD-10-CM | POA: Diagnosis not present

## 2024-02-14 DIAGNOSIS — I119 Hypertensive heart disease without heart failure: Secondary | ICD-10-CM | POA: Diagnosis not present

## 2024-02-14 DIAGNOSIS — R7303 Prediabetes: Secondary | ICD-10-CM | POA: Diagnosis not present

## 2024-02-14 DIAGNOSIS — E782 Mixed hyperlipidemia: Secondary | ICD-10-CM | POA: Diagnosis not present

## 2024-02-14 LAB — LAB REPORT - SCANNED
A1c: 5.8
EGFR: 80

## 2024-02-17 ENCOUNTER — Encounter: Payer: Self-pay | Admitting: Family Medicine

## 2024-05-12 ENCOUNTER — Ambulatory Visit: Payer: Self-pay | Admitting: *Deleted

## 2024-05-12 ENCOUNTER — Other Ambulatory Visit: Payer: Self-pay | Admitting: *Deleted

## 2024-05-12 DIAGNOSIS — E785 Hyperlipidemia, unspecified: Secondary | ICD-10-CM

## 2024-06-05 ENCOUNTER — Encounter: Payer: Self-pay | Admitting: Cardiology

## 2024-06-05 ENCOUNTER — Ambulatory Visit: Attending: Cardiology | Admitting: Cardiology

## 2024-06-05 VITALS — BP 126/76 | HR 81 | Ht 64.0 in | Wt 159.0 lb

## 2024-06-05 DIAGNOSIS — E785 Hyperlipidemia, unspecified: Secondary | ICD-10-CM | POA: Diagnosis not present

## 2024-06-05 DIAGNOSIS — I2510 Atherosclerotic heart disease of native coronary artery without angina pectoris: Secondary | ICD-10-CM | POA: Diagnosis not present

## 2024-06-05 DIAGNOSIS — I10 Essential (primary) hypertension: Secondary | ICD-10-CM | POA: Diagnosis not present

## 2024-06-05 NOTE — Patient Instructions (Addendum)
 Medication Instructions:  The current medical regimen is effective;  continue present plan and medications.  *If you need a refill on your cardiac medications before your next appointment, please call your pharmacy*  Follow-Up: At [facility], you and your health needs are our priority.  As part of our continuing mission to provide you with exceptional heart care, our providers are all part of one team.  This team includes your primary Cardiologist (physician) and Advanced Practice Providers or APPs (Physician Assistants and Nurse Practitioners) who all work together to provide you with the care you need, when you need it.  Your next appointment:   1 year(s)  Orren Fabry, PA  We recommend signing up for the patient portal called MyChart.  Sign up information is provided on this After Visit Summary.  MyChart is used to connect with patients for Virtual Visits (Telemedicine).  Patients are able to view lab/test results, encounter notes, upcoming appointments, etc.  Non-urgent messages can be sent to your provider as well.   To learn more about what you can do with MyChart, go to [email or web address].

## 2024-06-05 NOTE — Progress Notes (Signed)
 Cardiology Office Note:  .   Date:  06/05/2024  ID:  Karna VEAR Banter, DOB 01-31-1951, MRN [id number] PCP: Loreli Kins, MD  [facility] Providers Cardiologist:  Oneil Parchment, MD     History of Present Illness: .   Maria Lamb is a 73 y.o. female Discussed the use of AI scribe software for clinical note transcription with the patient, who gave verbal consent to proceed.  History of Present Illness Maria Lamb is a 73 year old female with coronary artery disease who presents for a follow-up visit.  She has a history of coronary artery disease with a circumflex stent placement in 2018 following a non-ST elevation myocardial infarction (NSTEMI). She experiences shortness of breath when walking uphill but denies any significant chest pain. She maintains an active lifestyle, walking her dogs daily and practicing yoga.  Her past medical history includes non-sustained ventricular tachycardia (NSVT) and hyperlipidemia. She was previously on atorvastatin  but is now taking a natural supplement for cholesterol management. Her LDL cholesterol was recorded at 131 mg/dL on February 14, 2024. She takes aspirin  daily after food and is on metoprolol , taking one tablet daily. She also uses supplements like turmeric for inflammation.  In 2018, an echocardiogram showed an ejection fraction of 65% with mild aortic valve regurgitation. She has a family history of heart issues, as her father had heart problems.  Socially, she is a new grandmother and enjoys spending time with her grandchildren. She is focused on maintaining a healthy lifestyle, although she admits to consuming too much processed foods.      ROS: No CP, mild SOB with hills  Studies Reviewed: .        Results LABS LDL: 131 (02/14/2024)  DIAGNOSTIC Echocardiogram: Ejection Fraction (EF) 65% with mild aortic valve regurgitation (2018) Risk Assessment/Calculations:            Physical Exam:   VS:  BP 126/76   Pulse 81    Ht 5' 4 (1.626 m)   Wt 159 lb (72.1 kg)   SpO2 97%   BMI 27.29 kg/m    Wt Readings from Last 3 Encounters:  06/05/24 159 lb (72.1 kg)  08/14/23 159 lb 3.2 oz (72.2 kg)  05/31/22 165 lb 6.4 oz (75 kg)    GEN: Well nourished, well developed in no acute distress NECK: No JVD; No carotid bruits CARDIAC: RRR, no murmurs, no rubs, no gallops RESPIRATORY:  Clear to auscultation without rales, wheezing or rhonchi  ABDOMEN: Soft, non-tender, non-distended EXTREMITIES:  No edema; No deformity   ASSESSMENT AND PLAN: .    Assessment and Plan Assessment & Plan Coronary artery disease Coronary artery disease with circumflex stent placement in 2018. Reports occasional dyspnea on exertion when walking uphill but otherwise well-managed with no recent angina or significant cardiovascular events. Engages in regular physical activity with daily walks and yoga. No smoking or diabetes. Family history of heart disease in father. - Continue aspirin  daily after food to minimize gastric irritation. - Continue metoprolol  as prescribed. - Encourage regular physical activity and healthy lifestyle.  Hyperlipidemia Hyperlipidemia with LDL level of 131 mg/dL. Not on atorvastatin , using natural supplements. Discussed importance of lowering LDL to <70 mg/dL, ideally <44 mg/dL, to reduce cardiovascular risk. Explained correlation between lower LDL levels and reduced myocardial infarction rates. Discussed non-statin options, including biannual injections. Offered referral to lipid clinic for further management. - Consider referral to lipid clinic for further management of hyperlipidemia. - Encourage dietary modifications to reduce processed foods  and follow a Mediterranean diet.  Mild aortic valve regurgitation Mild aortic valve regurgitation noted on echocardiogram in 2018 with an ejection fraction of 65%. No significant symptoms reported. - Continue regular monitoring of cardiovascular health. - Encourage  healthy lifestyle and regular physical activity.         Dispo: 1 yr [place]  Signed, Oneil Parchment, MD

## 2024-07-16 ENCOUNTER — Other Ambulatory Visit: Payer: Self-pay | Admitting: Physician Assistant

## 2024-07-27 ENCOUNTER — Other Ambulatory Visit: Payer: Self-pay | Admitting: Physician Assistant

## 2024-07-27 MED ORDER — METOPROLOL SUCCINATE ER 25 MG PO TB24: 50.0000 mg | ORAL_TABLET | Freq: Every day | ORAL | 3 refills | Status: AC

## 2024-08-18 DIAGNOSIS — I2510 Atherosclerotic heart disease of native coronary artery without angina pectoris: Secondary | ICD-10-CM | POA: Diagnosis not present

## 2024-08-18 DIAGNOSIS — F411 Generalized anxiety disorder: Secondary | ICD-10-CM | POA: Diagnosis not present

## 2024-08-18 DIAGNOSIS — R7303 Prediabetes: Secondary | ICD-10-CM | POA: Diagnosis not present

## 2024-08-18 DIAGNOSIS — Z0000 Encounter for general adult medical examination without abnormal findings: Secondary | ICD-10-CM | POA: Diagnosis not present

## 2024-08-18 DIAGNOSIS — K589 Irritable bowel syndrome without diarrhea: Secondary | ICD-10-CM | POA: Diagnosis not present

## 2024-08-18 DIAGNOSIS — I119 Hypertensive heart disease without heart failure: Secondary | ICD-10-CM | POA: Diagnosis not present

## 2024-08-18 DIAGNOSIS — Z1211 Encounter for screening for malignant neoplasm of colon: Secondary | ICD-10-CM | POA: Diagnosis not present

## 2024-08-18 DIAGNOSIS — Z87440 Personal history of urinary (tract) infections: Secondary | ICD-10-CM | POA: Diagnosis not present

## 2024-08-18 DIAGNOSIS — L409 Psoriasis, unspecified: Secondary | ICD-10-CM | POA: Diagnosis not present

## 2024-08-18 DIAGNOSIS — Z1331 Encounter for screening for depression: Secondary | ICD-10-CM | POA: Diagnosis not present

## 2024-08-18 DIAGNOSIS — E782 Mixed hyperlipidemia: Secondary | ICD-10-CM | POA: Diagnosis not present

## 2024-08-18 DIAGNOSIS — A6000 Herpesviral infection of urogenital system, unspecified: Secondary | ICD-10-CM | POA: Diagnosis not present

## 2024-08-18 LAB — LAB REPORT - SCANNED
A1c: 5.7
Albumin, Urine POC: 1.41
Creatinine, POC: 284 mg/dL
EGFR: 89
Microalb Creat Ratio: 5

## 2024-09-29 DIAGNOSIS — M9903 Segmental and somatic dysfunction of lumbar region: Secondary | ICD-10-CM | POA: Diagnosis not present

## 2024-09-29 DIAGNOSIS — M9901 Segmental and somatic dysfunction of cervical region: Secondary | ICD-10-CM | POA: Diagnosis not present

## 2024-09-29 DIAGNOSIS — M542 Cervicalgia: Secondary | ICD-10-CM | POA: Diagnosis not present

## 2024-09-29 DIAGNOSIS — M9902 Segmental and somatic dysfunction of thoracic region: Secondary | ICD-10-CM | POA: Diagnosis not present

## 2024-09-29 DIAGNOSIS — M9904 Segmental and somatic dysfunction of sacral region: Secondary | ICD-10-CM | POA: Diagnosis not present

## 2024-09-29 DIAGNOSIS — M5441 Lumbago with sciatica, right side: Secondary | ICD-10-CM | POA: Diagnosis not present

## 2024-10-01 DIAGNOSIS — M9907 Segmental and somatic dysfunction of upper extremity: Secondary | ICD-10-CM | POA: Diagnosis not present

## 2024-10-01 DIAGNOSIS — M9901 Segmental and somatic dysfunction of cervical region: Secondary | ICD-10-CM | POA: Diagnosis not present

## 2024-10-01 DIAGNOSIS — M9903 Segmental and somatic dysfunction of lumbar region: Secondary | ICD-10-CM | POA: Diagnosis not present

## 2024-10-01 DIAGNOSIS — M9902 Segmental and somatic dysfunction of thoracic region: Secondary | ICD-10-CM | POA: Diagnosis not present

## 2024-10-01 DIAGNOSIS — M9904 Segmental and somatic dysfunction of sacral region: Secondary | ICD-10-CM | POA: Diagnosis not present

## 2024-10-05 DIAGNOSIS — M9907 Segmental and somatic dysfunction of upper extremity: Secondary | ICD-10-CM | POA: Diagnosis not present

## 2024-10-05 DIAGNOSIS — M9904 Segmental and somatic dysfunction of sacral region: Secondary | ICD-10-CM | POA: Diagnosis not present

## 2024-10-05 DIAGNOSIS — M9903 Segmental and somatic dysfunction of lumbar region: Secondary | ICD-10-CM | POA: Diagnosis not present

## 2024-10-05 DIAGNOSIS — M9901 Segmental and somatic dysfunction of cervical region: Secondary | ICD-10-CM | POA: Diagnosis not present

## 2024-10-05 DIAGNOSIS — M9902 Segmental and somatic dysfunction of thoracic region: Secondary | ICD-10-CM | POA: Diagnosis not present
